# Patient Record
Sex: Male | Born: 1970 | Race: Black or African American | Hispanic: Yes | Marital: Married
Health system: Southern US, Community
[De-identification: ages and names within clinical notes are randomized; demographics above are authoritative.]

## PROBLEM LIST (undated history)

## (undated) DIAGNOSIS — F1029 Alcohol dependence with unspecified alcohol-induced disorder: Secondary | ICD-10-CM

---

## 2005-12-28 ENCOUNTER — Emergency Department: Payer: Self-pay | Admitting: Emergency Medicine

## 2006-11-29 ENCOUNTER — Emergency Department: Payer: Self-pay | Admitting: Emergency Medicine

## 2007-02-24 ENCOUNTER — Encounter: Payer: Self-pay | Admitting: Neurosurgery

## 2007-03-06 ENCOUNTER — Encounter: Payer: Self-pay | Admitting: Neurosurgery

## 2007-03-14 ENCOUNTER — Ambulatory Visit: Payer: Self-pay | Admitting: Neurosurgery

## 2007-04-07 ENCOUNTER — Ambulatory Visit (HOSPITAL_COMMUNITY): Admission: RE | Admit: 2007-04-07 | Discharge: 2007-04-07 | Payer: Self-pay | Admitting: Neurosurgery

## 2007-04-14 ENCOUNTER — Ambulatory Visit: Payer: Self-pay | Admitting: Neurosurgery

## 2008-11-19 IMAGING — CR DG CHEST 2V
2 series · 2 of 2 positions shown · non-contrast
Comparison: None

CLINICAL DATA: Preop HNP

CHEST - 2 VIEW:

[view not recorded (1 of 2)]
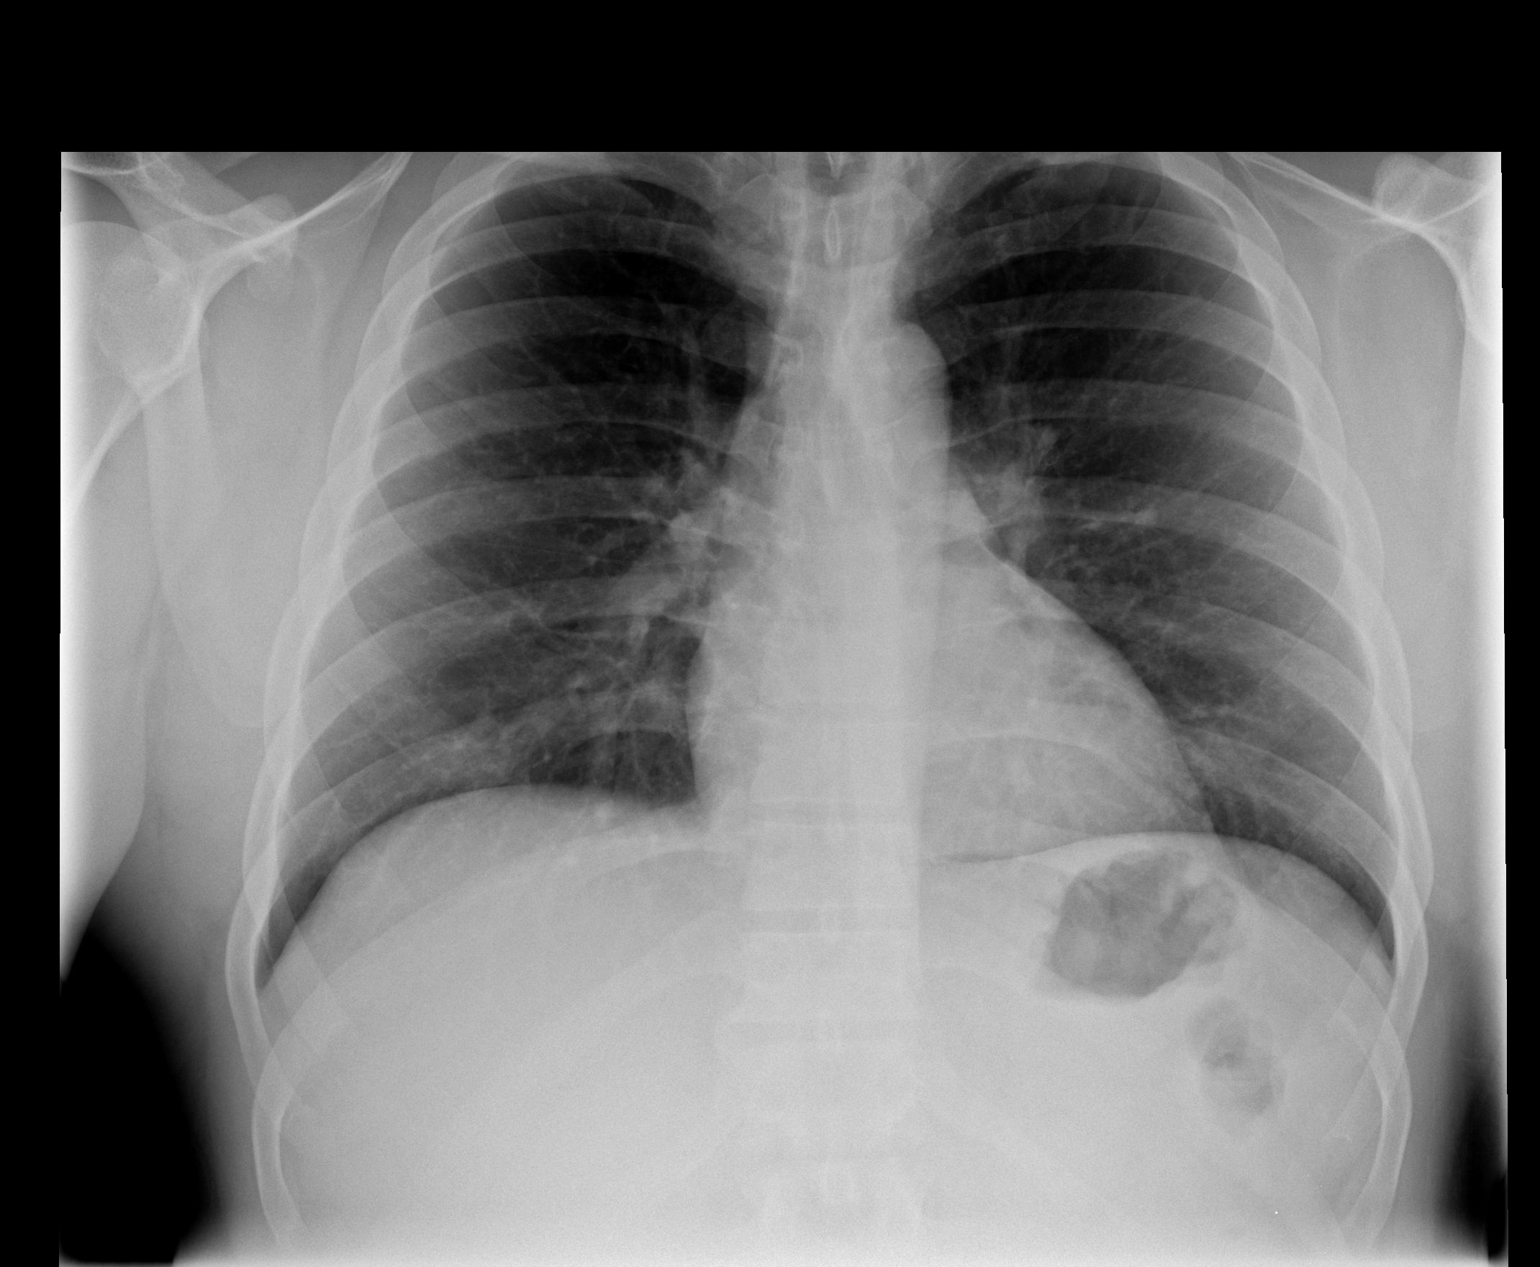

[view not recorded (2 of 2)]
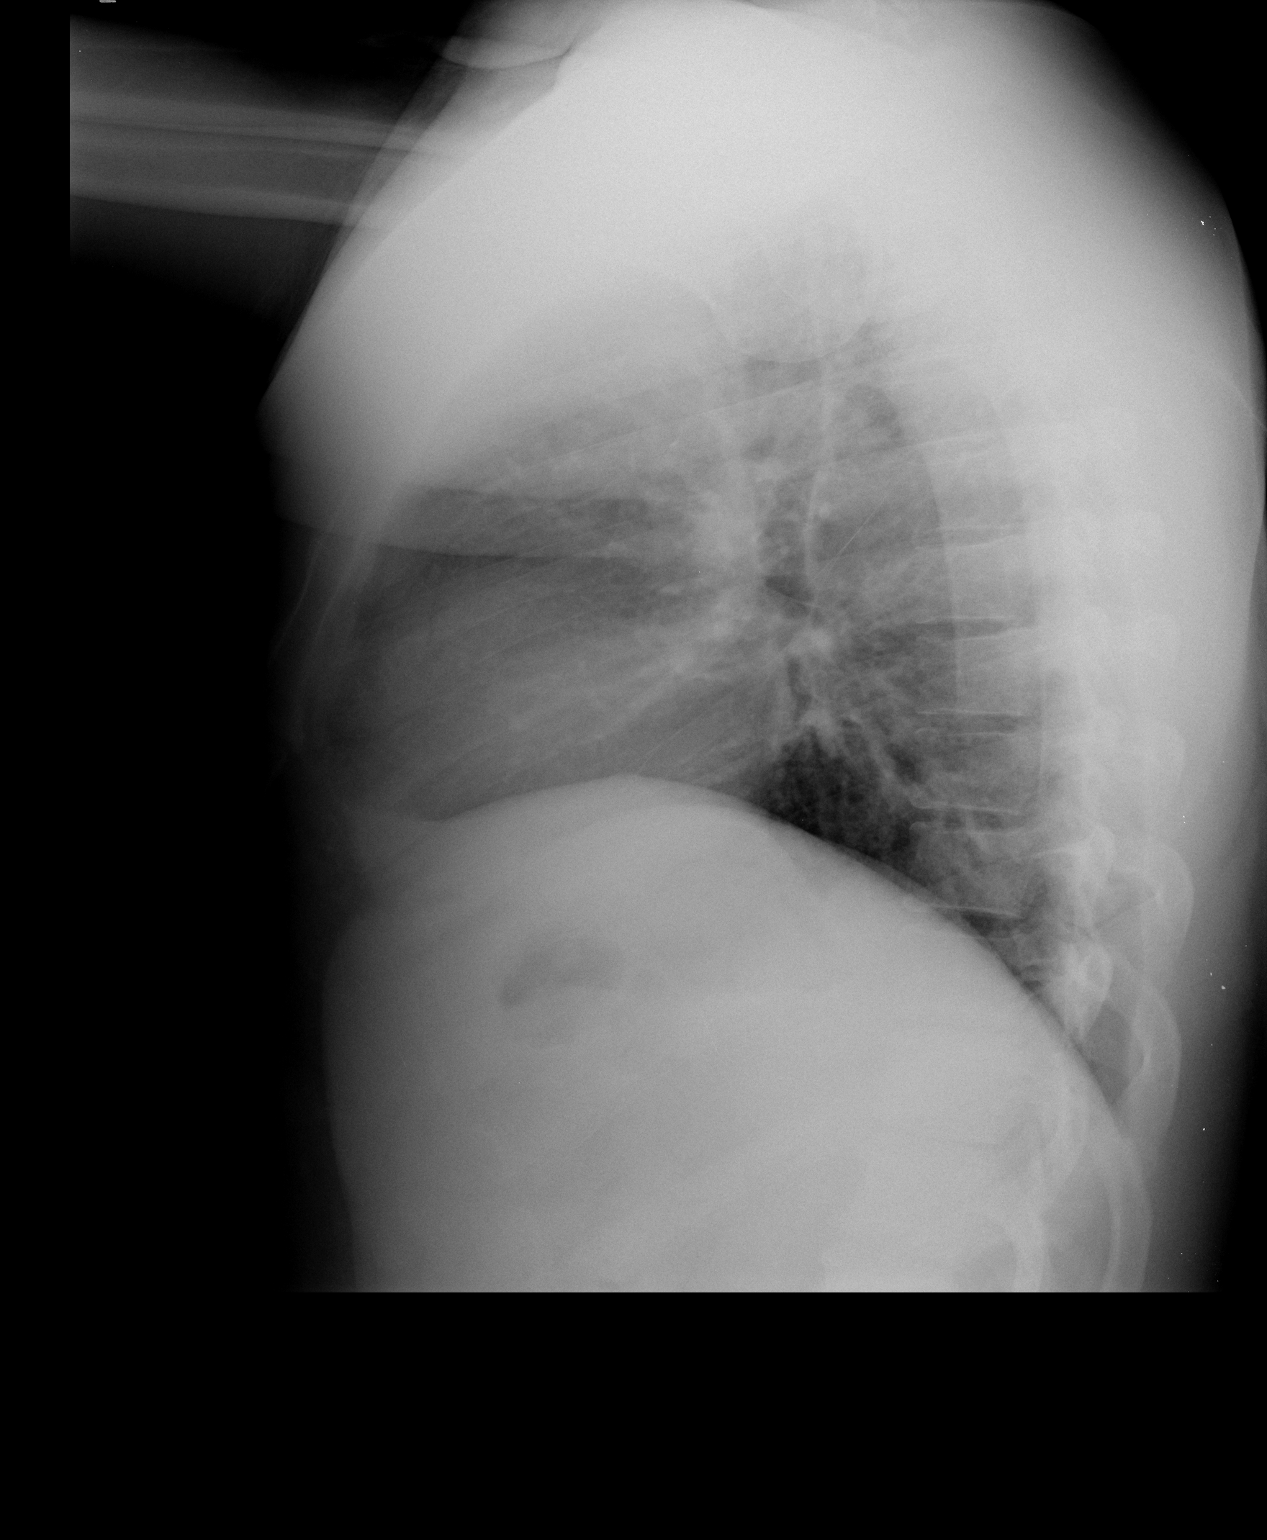

[2 of 2 positions shown; findings below may reference images not displayed]

FINDINGS: The heart size and mediastinal contours are within normal limits. 
Both lungs are clear. No effusions.  The visualized skeletal structures are
unremarkable.
IMPRESSION: No active cardiopulmonary disease

## 2008-11-22 IMAGING — RF DG CERVICAL SPINE 1V
1 series · 2 of 2 positions shown · non-contrast
Comparison: none

CLINICAL DATA: Herniated cervical disc.
 CERVICAL SPINE ? 1 VIEW:

[Series 1: run · 2 of 2 slices shown]
[im 1/2]
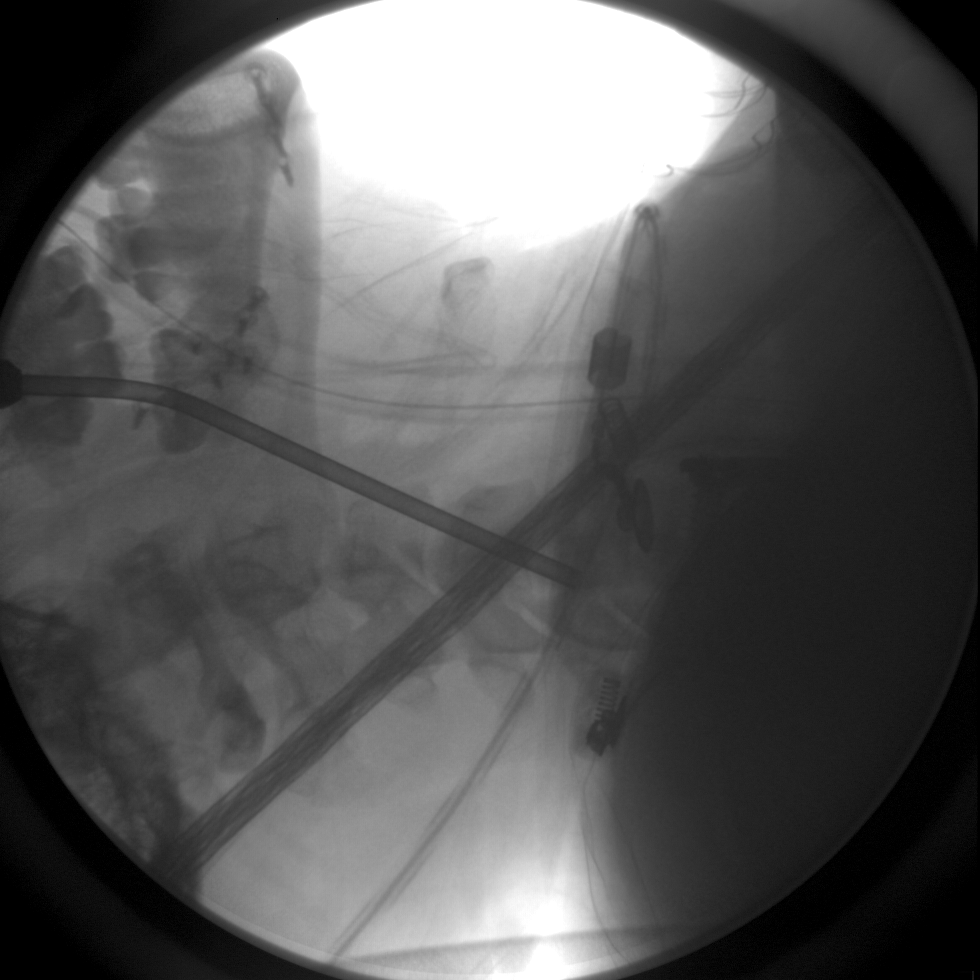
[im 2/2]
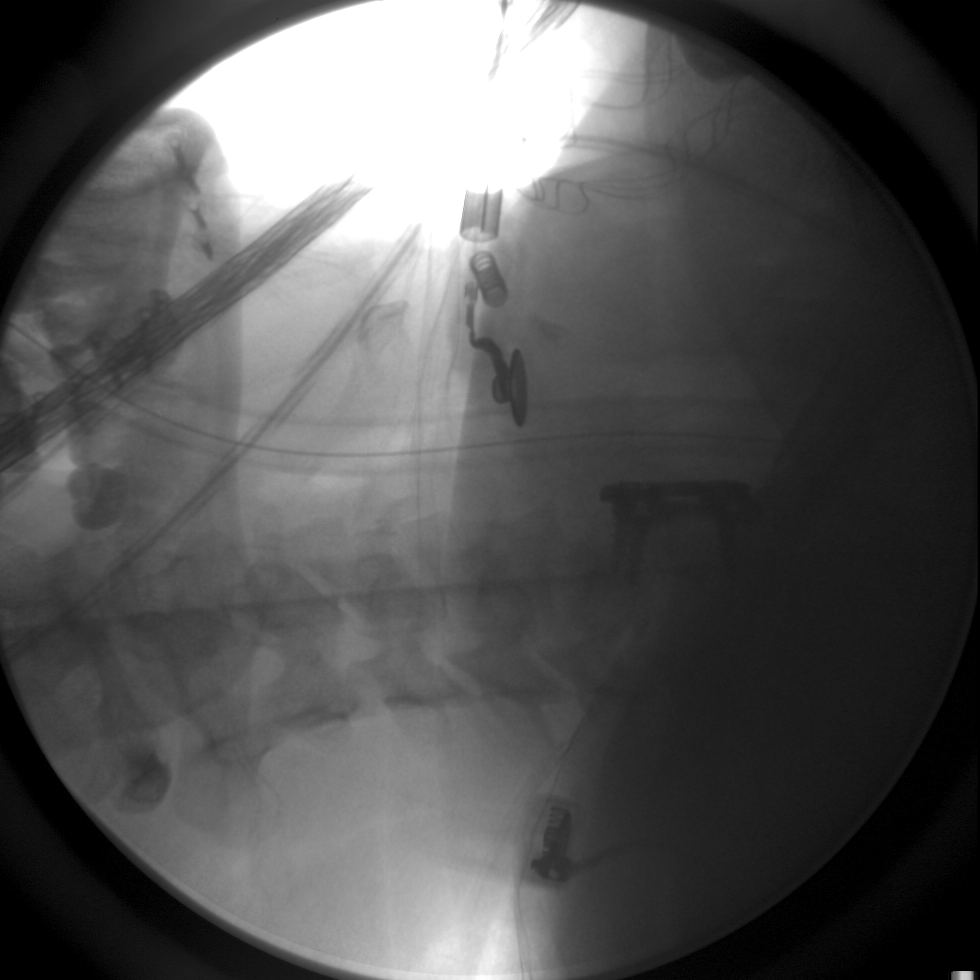

[2 of 2 positions shown; findings below may reference images not displayed]

FINDINGS: Two lateral views demonstrate that the patient has undergone anterior cervical fusion at the C6-7 level.  Anterior plate and screws are visible.  The posterior margins of C7 are not visible because of the patient?s shoulders.
IMPRESSION: Anterior cervical fusion performed at C6-7.

## 2009-04-15 ENCOUNTER — Ambulatory Visit: Payer: Self-pay | Admitting: Adult Health

## 2009-04-29 ENCOUNTER — Ambulatory Visit: Payer: Self-pay | Admitting: Adult Health

## 2009-10-15 ENCOUNTER — Emergency Department: Payer: Self-pay | Admitting: Emergency Medicine

## 2009-10-30 ENCOUNTER — Ambulatory Visit: Payer: Self-pay | Admitting: Family Medicine

## 2009-10-31 ENCOUNTER — Ambulatory Visit: Payer: Self-pay | Admitting: Family Medicine

## 2009-11-02 ENCOUNTER — Emergency Department: Payer: Self-pay | Admitting: Emergency Medicine

## 2010-01-07 ENCOUNTER — Ambulatory Visit: Payer: Self-pay | Admitting: Family Medicine

## 2010-02-13 ENCOUNTER — Emergency Department: Payer: Self-pay | Admitting: Emergency Medicine

## 2010-02-21 ENCOUNTER — Encounter: Admission: RE | Admit: 2010-02-21 | Discharge: 2010-02-21 | Payer: Self-pay | Admitting: Neurosurgery

## 2010-03-23 ENCOUNTER — Ambulatory Visit: Payer: Self-pay | Admitting: Neurosurgery

## 2010-08-18 NOTE — Op Note (Signed)
NAMEJAPHETH, [name]               ACCOUNT NO.:  [id number]   MEDICAL RECORD NO.:  [id number]          PATIENT TYPE:  AMB   LOCATION:  SDS                          FACILITY:  [facility]   PHYSICIAN:  Donalee Citrin, M.D.        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   PROCEDURE:  Anterior cervical diskectomy and fusion at C6-7 using a 7 mm  Cornerstone allograft wedge and a 25 mm venture plate with four 32 mm  Venture plate with __________ screws.   ASSISTANT:  Yetta Barre.   ANESTHESIA:  General endotracheal.   PROCEDURE IN DETAIL:  The patient is a very pleasant 40 year old  gentleman who has had progressive worsening neck and bilateral arm pain  radiating down the first 2 fingers of the left hand predominantly but  also over on the right side.  MRI scan showed severe spondylitic disease  with spondylitic compression of the left C7 nerve root and cervical  stenosis as well as motor changes in the endplates at this level.  The  patient failed all forms of conservative treatment and was recommended  anterior diskectomy and fusion.  The risks and benefits of the operation  were explained to the patient who understood and agreed to proceed  forward.   The patient was brought to the operating room and was induced under  general anesthesia.  The patient was placed supine __________  pounds of  traction on the right side.  The neck was prepped and draped in the  usual fashion.  Preoperative x-ray localized the appropriate level.  A  curvilinear incision was made just off midline to the anterior portion  of the sternocleidomastoid.  The superficial layer of the of the  platysma was dissected out and divided longitudinally.  The avascular  plane between the sternocleidomastoid and the strap  muscles was  developed down to the prevertebral fascia.  Prevertebral fascia was  dissected with Kitners.  Intraoperative x-ray confirmed localization of  the C5-6 level purposefully marking the level above for visualization.  Then the level below that was then incised.  Large anterior osteophyte  was bitten off with a Leksell rongeur and disk space was cleaned out  using __________ Kerrison punch biting off anterior osteophytes off of  C6.  High-speed drill used to drill down the interspace down the  posterior annulus __________ ligament.  At this point, the operating  microscopic was draped and brought onto the field __________ .  The  undersurface of the endplates of both under bitten aggressively.  The  PLL was under bitten decompressing the central canal.  There was a very  large osteophyte coming off the uncinate process compressing the C7  nerve root on the left.  This was all under bitten decompressing and  skeletonizing the C7 nerve root flush with the pedicle out to the left  side.  Then the endplates were under bitten  to decompress the central  canal, marching across to the right side decompressing the right C7  nerve root.  Meticulous hemostasis was maintained.  The endplates were  scraped.  After adequate decompression had been achieved a similar graft  was inserted and a 25 mm Venture plate was placed.  All four 32 mm  screws were drilled in place and all screws had excellent purchase.  Wound was copiously irrigated.  Meticulous hemostasis was maintained.  The wound was closed in layers with interrupted Vicryl in the platysma  and a running 4-0 subcuticular.  Benzoin and Steri-Strips were applied.  The patient went to the recovery room in stable condition.  At the end  of the case needle and sponge counts were correct.           ______________________________  Donalee Citrin, M.D.     GC/MEDQ  D:  04/07/2007  T:  04/07/2007  Job:  [id number]

## 2011-01-08 LAB — BASIC METABOLIC PANEL
BUN: 14
CO2: 29
Calcium: 9.7
Chloride: 105
Creatinine, Ser: 1.08
GFR calc Af Amer: >60
Glucose, Bld: 110 — ABNORMAL HIGH

## 2011-01-08 LAB — HEPATIC FUNCTION PANEL
AST: 38 — ABNORMAL HIGH
Albumin: 3.9
Total Bilirubin: 0.7
Total Protein: 7.4

## 2011-01-08 LAB — CBC
MCHC: 35
MCV: 87.3
Platelets: 411 — ABNORMAL HIGH
RBC: 5.04
RDW: 12.3

## 2011-01-08 LAB — DIFFERENTIAL
Basophils Absolute: 0
Basophils Relative: 0
Eosinophils Absolute: 0.1
Monocytes Relative: 8
Neutro Abs: 5.9
Neutrophils Relative %: 59

## 2011-01-08 LAB — APTT: aPTT: 31

## 2011-01-08 LAB — PROTIME-INR
INR: 1
Prothrombin Time: 13.2

## 2011-08-24 ENCOUNTER — Emergency Department: Payer: Self-pay | Admitting: *Deleted

## 2012-05-02 ENCOUNTER — Emergency Department: Payer: Self-pay | Admitting: Unknown Physician Specialty

## 2012-05-02 LAB — COMPREHENSIVE METABOLIC PANEL
Albumin: 3.8 g/dL (ref 3.4–5.0)
Alkaline Phosphatase: 60 U/L (ref 50–136)
Anion Gap: 5 — ABNORMAL LOW (ref 7–16)
BUN: 13 mg/dL (ref 7–18)
Calcium, Total: 8.6 mg/dL (ref 8.5–10.1)
Chloride: 109 mmol/L — ABNORMAL HIGH (ref 98–107)
Glucose: 88 mg/dL (ref 65–99)
Osmolality: 281 (ref 275–301)
Potassium: 4.1 mmol/L (ref 3.5–5.1)
Sodium: 141 mmol/L (ref 136–145)

## 2012-05-02 LAB — CBC WITH DIFFERENTIAL/PLATELET
Eosinophil #: 0.2 10*3/uL (ref 0.0–0.7)
Lymphocyte %: 33.4 %
MCH: 30.9 pg (ref 26.0–34.0)
Monocyte #: 0.8 x10 3/mm (ref 0.2–1.0)
Platelet: 364 10*3/uL (ref 150–440)
RBC: 4.91 10*6/uL (ref 4.40–5.90)

## 2012-05-02 LAB — SEDIMENTATION RATE: Erythrocyte Sed Rate: 1 mm/hr (ref 0–15)

## 2012-08-04 LAB — COMPREHENSIVE METABOLIC PANEL
Albumin: 4 g/dL (ref 3.4–5.0)
Alkaline Phosphatase: 59 U/L (ref 50–136)
Anion Gap: 5 — ABNORMAL LOW (ref 7–16)
BUN: 11 mg/dL (ref 7–18)
Bilirubin,Total: 0.4 mg/dL (ref 0.2–1.0)
Chloride: 108 mmol/L — ABNORMAL HIGH (ref 98–107)
EGFR (Non-African Amer.): >60
Osmolality: 283 (ref 275–301)
SGOT(AST): 37 U/L (ref 15–37)
Sodium: 143 mmol/L (ref 136–145)
Total Protein: 8.4 g/dL — ABNORMAL HIGH (ref 6.4–8.2)

## 2012-08-04 LAB — TSH: Thyroid Stimulating Horm: 1.28 u[IU]/mL

## 2012-08-04 LAB — CBC
MCH: 31.2 pg (ref 26.0–34.0)
MCHC: 34.7 g/dL (ref 32.0–36.0)

## 2012-08-04 LAB — ETHANOL: Ethanol %: <0.003 % (ref 0.000–0.080)

## 2012-08-05 ENCOUNTER — Inpatient Hospital Stay: Payer: Self-pay | Admitting: Psychiatry

## 2012-08-05 LAB — DRUG SCREEN, URINE: Benzodiazepine, Ur Scrn: NEGATIVE (ref ?–200)

## 2012-10-27 ENCOUNTER — Emergency Department: Payer: Self-pay | Admitting: Emergency Medicine

## 2012-10-27 LAB — BASIC METABOLIC PANEL
BUN: 13 mg/dL (ref 7–18)
Calcium, Total: 9.3 mg/dL (ref 8.5–10.1)
Creatinine: 1.22 mg/dL (ref 0.60–1.30)
Potassium: 3.9 mmol/L (ref 3.5–5.1)
Sodium: 139 mmol/L (ref 136–145)

## 2012-10-27 LAB — URINALYSIS, COMPLETE
Bilirubin,UR: NEGATIVE
Blood: NEGATIVE
Ketone: NEGATIVE
Protein: NEGATIVE
RBC,UR: 2 /HPF (ref 0–5)
Specific Gravity: 1.023 (ref 1.003–1.030)
Squamous Epithelial: <1

## 2012-10-27 LAB — CBC
Platelet: 331 10*3/uL (ref 150–440)
RBC: 4.75 10*6/uL (ref 4.40–5.90)
WBC: 9.1 10*3/uL (ref 3.8–10.6)

## 2013-04-10 IMAGING — CR DG CHEST 2V
1 series · 2 of 2 positions shown · non-contrast
Comparison: none

REASON FOR EXAM: productive cough, fever
COMMENTS:

[Series 1: w chest pa · 0.14mm/px · 2 of 2 slices shown]
[im 1/2]
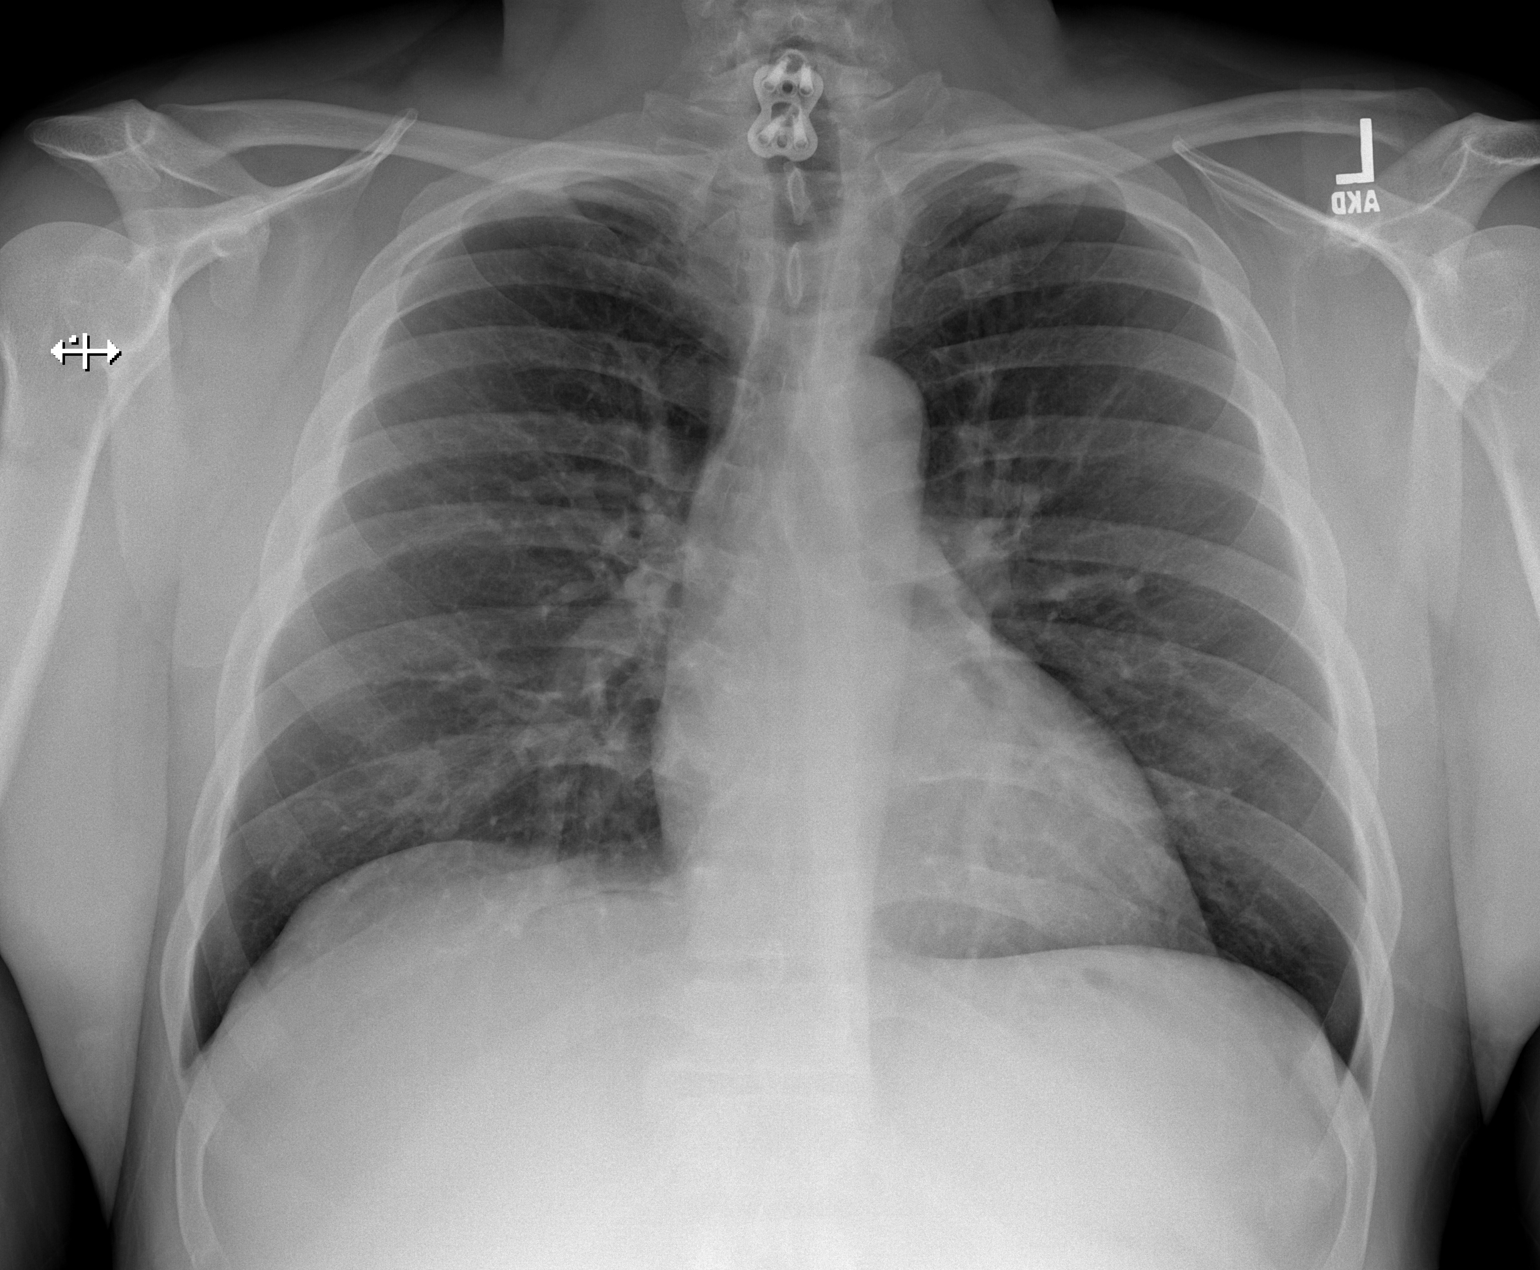
[im 2/2]
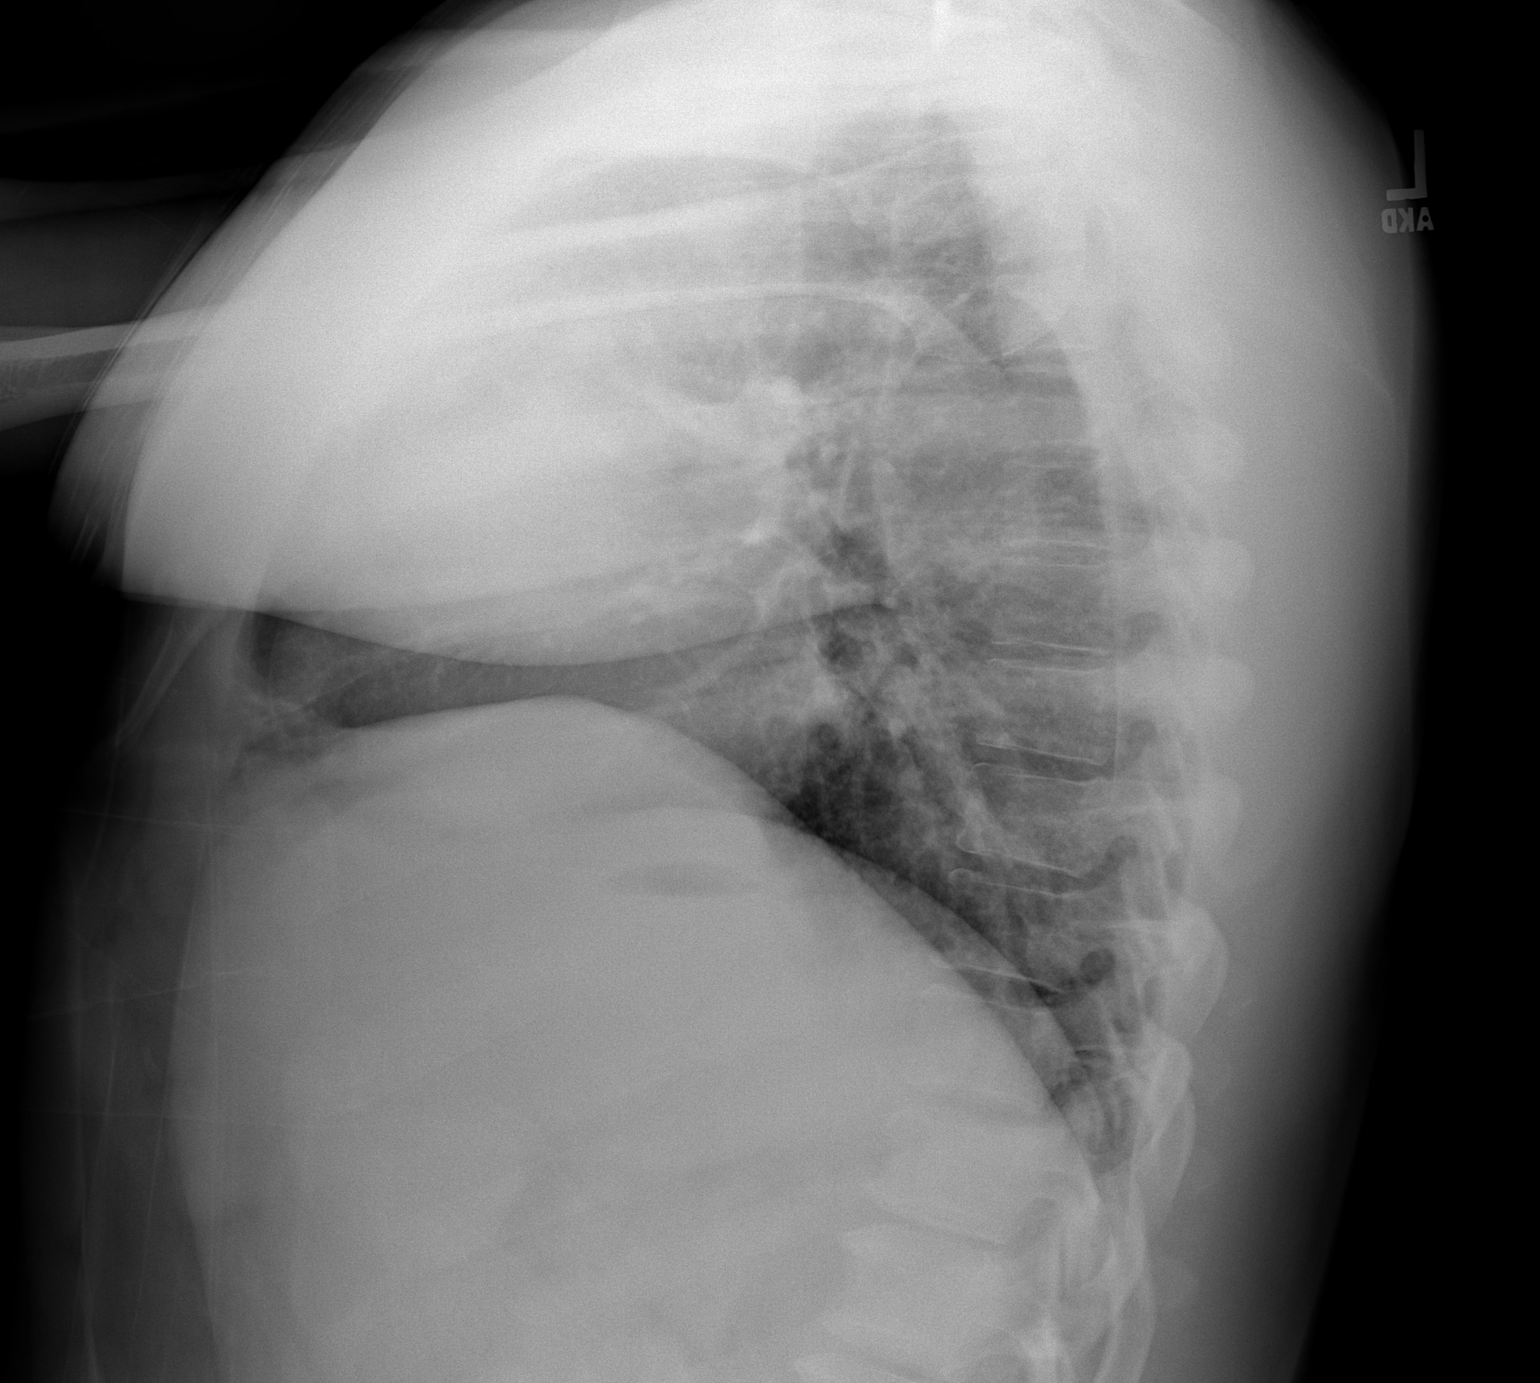

[2 of 2 positions shown; findings below may reference images not displayed]

PROCEDURE:     DXR - DXR CHEST PA (OR AP) AND LATERAL  - August 24, 2011 [DATE]

RESULT:     And lower anterior cervical fusion hardware is present. The
lungs are clear. The heart and pulmonary vessels are normal. The bony and
mediastinal structures are unremarkable. There is no effusion. There is no
pneumothorax or evidence of congestive failure.
IMPRESSION: No acute cardiopulmonary disease.

[REDACTED]

## 2013-12-18 IMAGING — CR DG LUMBAR SPINE 2-3V
1 series · 3 of 3 positions shown · non-contrast
Comparison: none

REASON FOR EXAM: back pain
COMMENTS:

PROCEDURE:     DXR - DXR LUMBAR SPINE AP AND LATERAL  - May 02, 2012  [DATE]
RESULT:     Lumbar spine images demonstrate normal alignment. The disc
spaces and vertebral body heights are maintained. There is no bony
destruction.

[Series 1: ap · 0.17mm/px · 3 of 3 slices shown]
[im 1/3]
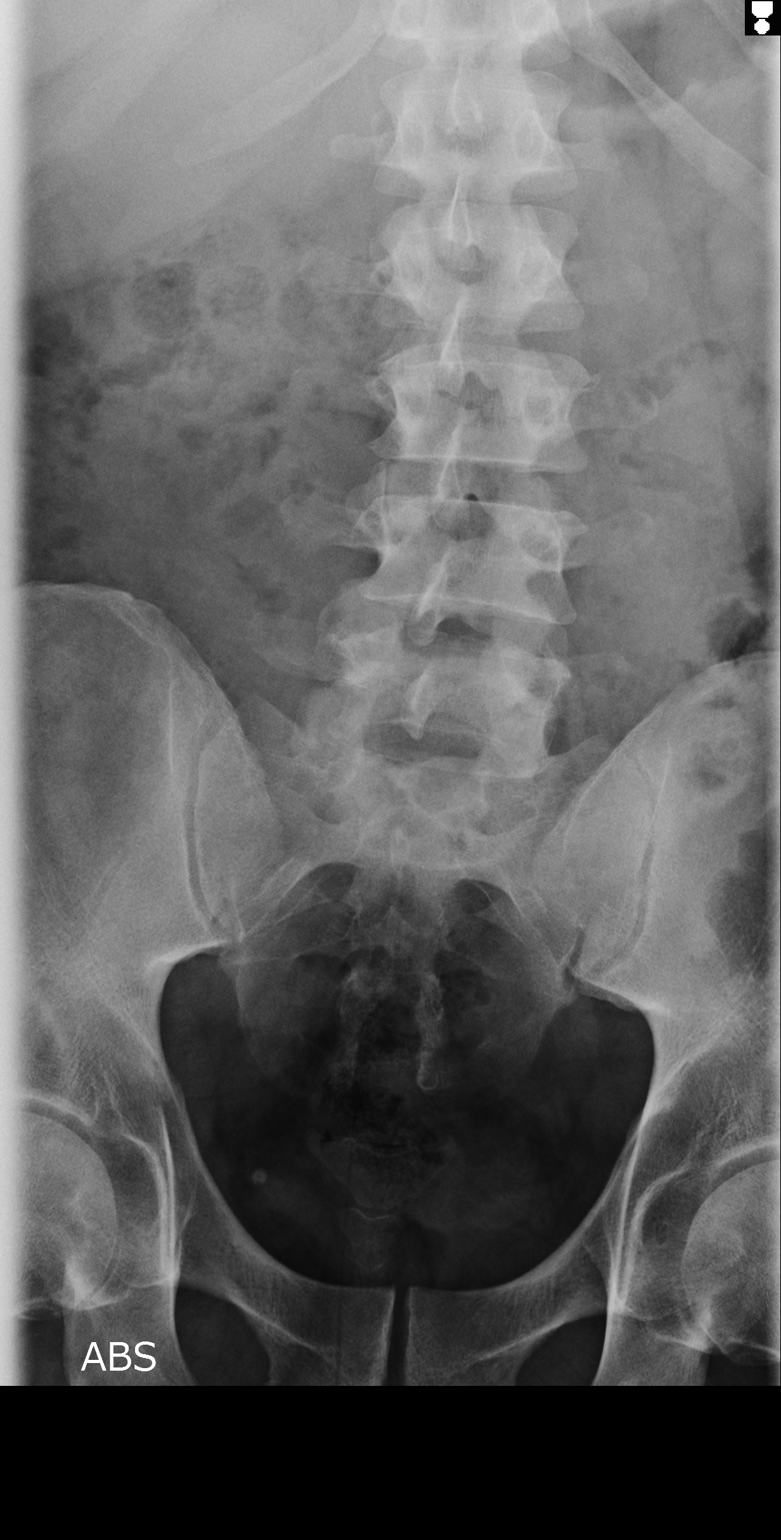
[im 2/3]
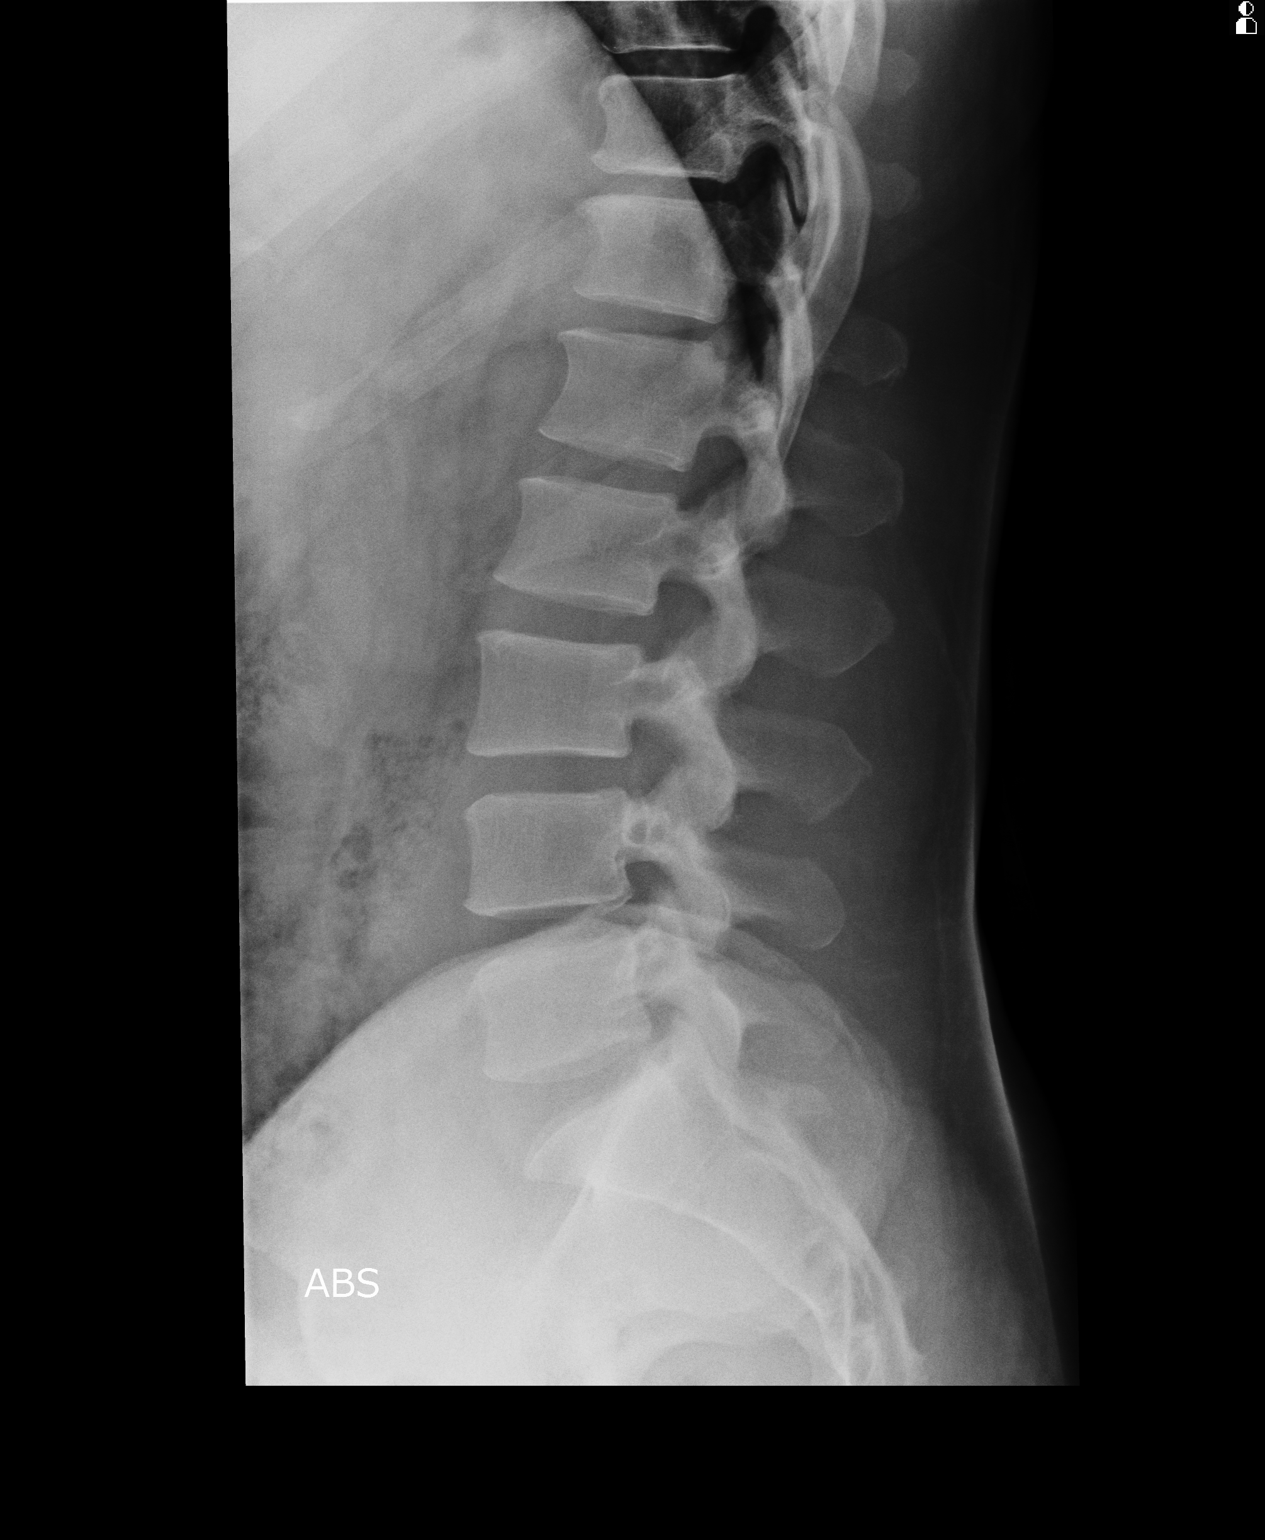
[im 3/3]
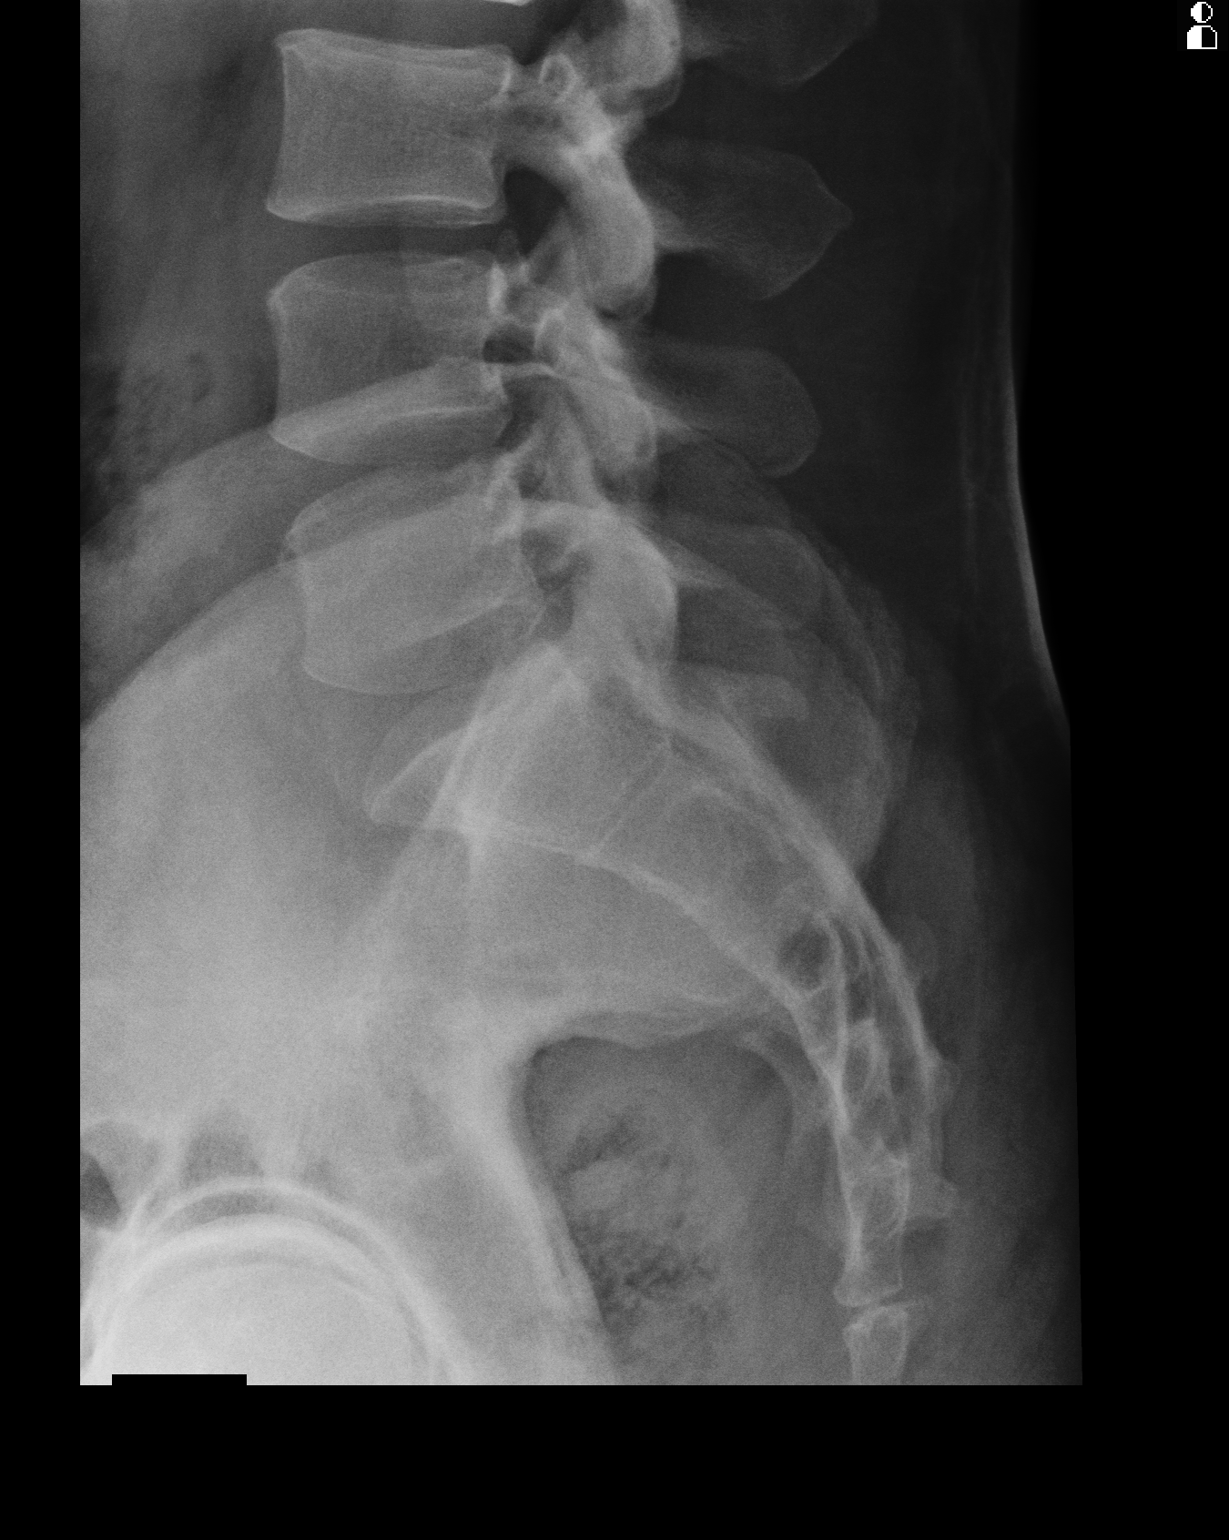

[3 of 3 positions shown; findings below may reference images not displayed]

IMPRESSION: 1. No acute bony abnormality. No significant degenerative disease.

[REDACTED]
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
## 2014-07-26 NOTE — Discharge Summary (Signed)
PATIENT NAME:  [name], Sincere MR#:  [id number] DATE OF BIRTH:  [birth year]  DATE OF ADMISSION:  08/05/2012 DATE OF DISCHARGE:  08/07/2012  HOSPITAL COURSE: See dictated history and physical for details of admission.   This 44 year old man presented to the Emergency Room stating that he was tired of his lifestyle of heavy drinking and cocaine abuse. Also reporting depression and anxiety with suicidal thoughts. The patient was put on the detox protocol and has tolerated that fine. Blood pressure has been running a little high, but there is no sign of seizures or delirium. Mood continues to be somewhat dysphoric, without active suicidal intent.   He was started on citalopram 40 mg a day at the time of admission and so far has tolerated that fine. A [facility] [place]. The patient would be best-served by a specialized substance abuse treatment program for both detox and longer-term treatment. He is under an involuntary commitment petition. Will be transferred there today. The patient has been educated about this and informed of the rationale and plan for treatment and transfer.   DISCHARGE MEDICATIONS: Citalopram 40 mg per day, magnesium 400 mg per day, nicotine patch 21 mg per day, thiamine 100 mg per day; detox orders otherwise as done at the receiving facility.   LABORATORY RESULTS: Admission labs showed chemistry with a creatinine elevated at 1.4, chloride elevated at 108, total protein elevated 8.4. Alcohol undetected.   CBC elevated 13.4. TSH normal at 1.28, drug screen positive for cocaine.   MENTAL STATUS EXAM AT DISCHARGE: Reasonably well-groomed man, cooperative with the interview. Intermittent eye contact. Psychomotor activity normal. Speech normal in rate, tone and volume. Affect a little bit blunted and constricted, but not severely so. Affect not sad or tearful or hostile. Mood stated as still being nervous. Thoughts are lucid,  with no evidence of loosening of associations or delusions. Denies auditory or visual hallucinations. Denies suicidal or homicidal ideation. Shows adequate judgment and insight. Normal intelligence.   DISCHARGE PLAN: Transfer under involuntary commitment to [facility] [place].   DIAGNOSIS, PRINCIPAL AND PRIMARY:   AXIS I: Alcohol dependence.   SECONDARY DIAGNOSES: AXIS I:  1.  Cocaine dependence.  2.  Substance-induced mood disorder, depressed.   AXIS II: Deferred.  AXIS III: No diagnosis.  AXIS IV: Moderate-to-severe from multiple losses from substance abuse.  AXIS V: Functioning at time of transfer: 50.    ____________________________ Audery AmelJohn T. Jhett Fretwell, MD jtc:dm D: 08/07/2012 12:57:30 ET T: 08/07/2012 13:30:26 ET JOB#: [id number]  cc: Audery AmelJohn T. Jurrell Royster, MD, <Dictator> Audery AmelJOHN T Almeter Westhoff MD ELECTRONICALLY SIGNED 08/08/2012 13:54

## 2019-04-03 ENCOUNTER — Inpatient Hospital Stay
Admit: 2019-04-03 | Discharge: 2019-04-08 | Disposition: A | Discharge status: Home / Self Care | Payer: PRIVATE HEALTH INSURANCE | Source: Ambulatory Visit | Admitting: Student in an Organized Health Care Education/Training Program

## 2019-04-03 DIAGNOSIS — F1029 Alcohol dependence with unspecified alcohol-induced disorder: Secondary | ICD-10-CM

## 2019-04-03 LAB — BASIC METABOLIC PANEL
Anion Gap: 8 NA
BUN: 9 mg/dL (ref 7–20)
CO2: 28 mmol/L (ref 22–30)
Calcium: 9.3 mg/dL (ref 8.4–10.4)
Chloride: 107 mmol/L (ref 98–107)
Creatinine: 0.97 mg/dL (ref 0.52–1.25)
EGFR IF NonAfrican American: >90 mL/min (ref >60)
Glucose: 88 mg/dL (ref 70–100)
Potassium: 4 mmol/L (ref 3.5–5.1)
Sodium: 143 mmol/L (ref 135–145)
eGFR African American: >90 mL/min (ref >60)

## 2019-04-03 LAB — CBC WITH AUTO DIFFERENTIAL
Absolute Baso #: 0 10*3/uL (ref 0.0–0.2)
Absolute Eos #: 0.1 10*3/uL (ref 0.0–0.5)
Absolute Lymph #: 2.3 10*3/uL (ref 1.0–4.3)
Absolute Mono #: 0.9 10*3/uL — ABNORMAL HIGH (ref 0.0–0.8)
Absolute Neut #: 5 10*3/uL (ref 1.8–7.0)
Basophils: 0.6 % (ref 0.0–2.0)
Eosinophils: 1.7 % (ref 1.0–6.0)
Granulocytes %: 58.8 % (ref 40.0–80.0)
Hematocrit: 43.4 % (ref 40.0–52.0)
Hemoglobin: 14.8 g/dL (ref 13.0–18.0)
Lymphocyte %: 27.8 % (ref 20.0–40.0)
MCH: 31.9 pg (ref 26.0–34.0)
MCHC: 34.1 % (ref 32.0–36.0)
MCV: 93.4 fL (ref 80.0–98.0)
MPV: 6.5 fL — ABNORMAL LOW (ref 7.4–10.4)
Monocytes: 11.1 % — ABNORMAL HIGH (ref 2.0–10.0)
Platelets: 377 10*3/uL (ref 140–440)
RBC: 4.65 10*6/uL (ref 4.40–5.90)
RDW: 13.7 % (ref 11.5–14.5)
WBC: 8.4 10*3/uL (ref 3.6–10.7)

## 2019-04-03 LAB — URINE DRUG SCREEN
Amphetamines, urine: NEGATIVE NA
Barbiturates, Urine: NEGATIVE NA
Benzodiazepine Ur Qual: NEGATIVE NA
Cocaine Metabolites, Ur: POSITIVE NA
Methadone, Urine: NEGATIVE NA
Opiates, Urine: NEGATIVE NA
Oxycodone Screen, Ur: NEGATIVE NA
PCP, Urine: NEGATIVE NA

## 2019-04-03 LAB — HEPATIC FUNCTION PANEL
ALT: 36 U/L (ref 0–49)
AST: 95 U/L — ABNORMAL HIGH (ref 15–46)
Albumin,Serum: 4.3 g/dL (ref 3.5–5.0)
Alkaline Phosphatase: 68 U/L (ref 38–126)
Bilirubin, Direct: 0 mg/dL (ref 0.0–0.3)
Total Bilirubin: 0.6 mg/dL (ref 0.2–1.3)
Total Protein: 7.8 g/dL (ref 6.3–8.2)

## 2019-04-03 LAB — D-DIMER, QUANTITATIVE: D-Dimer, Quant: <0.19 mg/L (ref 0.00–0.50)

## 2019-04-03 LAB — LACTATE DEHYDROGENASE: LD: 286 U/L — ABNORMAL HIGH (ref 120–246)

## 2019-04-03 LAB — PROCALCITONIN: Procalcitonin: <0.1 ng/mL (ref <0.10)

## 2019-04-03 LAB — C-REACTIVE PROTEIN: CRP: <5 mg/L (ref 0.0–6.0)

## 2019-04-03 LAB — ETHANOL: Ethanol Lvl: <0.01 g/dL (ref 0.000–0.010)

## 2019-04-03 LAB — FERRITIN: Ferritin: 90 ng/mL (ref 18–464)

## 2019-04-03 LAB — COVID-19: SARS-CoV-2: DETECTED — AB

## 2019-04-03 MED ORDER — POLYETHYLENE GLYCOL 3350 17 G PO PACK
17 g | Freq: Every day | ORAL | Status: DC | PRN
Start: 2019-04-03 — End: 2019-04-08

## 2019-04-03 MED ORDER — MELATONIN 5 MG PO TABS
5 MG | Freq: Every evening | ORAL | Status: DC | PRN
Start: 2019-04-03 — End: 2019-04-08
  Administered 2019-04-04 – 2019-04-08 (×2): 5 mg via ORAL

## 2019-04-03 MED ORDER — AMLODIPINE BESYLATE 5 MG PO TABS
5 MG | Freq: Once | ORAL | Status: AC
Start: 2019-04-03 — End: 2019-04-03
  Administered 2019-04-03: 18:00:00 5 mg via ORAL

## 2019-04-03 MED ORDER — NORMAL SALINE FLUSH 0.9 % IV SOLN
0.9 % | INTRAVENOUS | Status: DC | PRN
Start: 2019-04-03 — End: 2019-04-08

## 2019-04-03 MED ORDER — DIPHENHYDRAMINE HCL 50 MG/ML IJ SOLN
50 MG/ML | Freq: Once | INTRAMUSCULAR | Status: DC
Start: 2019-04-03 — End: 2019-04-03

## 2019-04-03 MED ORDER — PHENOBARBITAL 32.4 MG PO TABS
32.4 MG | Freq: Once | ORAL | Status: AC
Start: 2019-04-03 — End: 2019-04-03
  Administered 2019-04-03: 17:00:00 97.2 mg via ORAL

## 2019-04-03 MED ORDER — THIAMINE MONONITRATE 100 MG PO TABS
100 MG | Freq: Every day | ORAL | Status: DC
Start: 2019-04-03 — End: 2019-04-04
  Administered 2019-04-03: 23:00:00 100 mg via ORAL

## 2019-04-03 MED ORDER — ACETAMINOPHEN 650 MG RE SUPP
650 MG | Freq: Four times a day (QID) | RECTAL | Status: DC | PRN
Start: 2019-04-03 — End: 2019-04-08

## 2019-04-03 MED ORDER — CLONIDINE HCL 0.1 MG PO TABS
0.1 MG | Freq: Once | ORAL | Status: AC
Start: 2019-04-03 — End: 2019-04-03
  Administered 2019-04-03: 18:00:00 0.1 mg via ORAL

## 2019-04-03 MED ORDER — ENOXAPARIN SODIUM 40 MG/0.4ML SC SOLN
40 MG/0.4ML | Freq: Every day | SUBCUTANEOUS | Status: DC
Start: 2019-04-03 — End: 2019-04-08
  Administered 2019-04-03 – 2019-04-07 (×4): 40 mg via SUBCUTANEOUS

## 2019-04-03 MED ORDER — ACETAMINOPHEN 325 MG PO TABS
325 MG | Freq: Four times a day (QID) | ORAL | Status: DC | PRN
Start: 2019-04-03 — End: 2019-04-08
  Administered 2019-04-04 – 2019-04-05 (×3): 650 mg via ORAL

## 2019-04-03 MED ORDER — FOLIC ACID 1 MG PO TABS
1 MG | Freq: Every day | ORAL | Status: DC
Start: 2019-04-03 — End: 2019-04-08
  Administered 2019-04-03 – 2019-04-08 (×6): 1 mg via ORAL

## 2019-04-03 MED ORDER — METOCLOPRAMIDE HCL 5 MG/ML IJ SOLN
5 MG/ML | Freq: Once | INTRAMUSCULAR | Status: DC
Start: 2019-04-03 — End: 2019-04-03

## 2019-04-03 MED ORDER — ONDANSETRON HCL 4 MG/2ML IJ SOLN
4 MG/2ML | Freq: Four times a day (QID) | INTRAMUSCULAR | Status: DC | PRN
Start: 2019-04-03 — End: 2019-04-08

## 2019-04-03 MED ORDER — PROMETHAZINE HCL 25 MG PO TABS
25 MG | Freq: Four times a day (QID) | ORAL | Status: DC | PRN
Start: 2019-04-03 — End: 2019-04-08

## 2019-04-03 MED ORDER — NORMAL SALINE FLUSH 0.9 % IV SOLN
0.9 | Freq: Two times a day (BID) | INTRAVENOUS | Status: DC
Start: 2019-04-03 — End: 2019-04-08
  Administered 2019-04-04: 04:00:00 10 mL via INTRAVENOUS
  Administered 2019-04-04: 14:00:00 3 mL via INTRAVENOUS
  Administered 2019-04-05 – 2019-04-08 (×7): 10 mL via INTRAVENOUS

## 2019-04-03 MED ORDER — PHENOBARBITAL SODIUM 65 MG/ML IJ SOLN
65 MG/ML | Freq: Three times a day (TID) | INTRAMUSCULAR | Status: DC
Start: 2019-04-03 — End: 2019-04-04
  Administered 2019-04-03 – 2019-04-04 (×3): 65 mg via INTRAVENOUS

## 2019-04-03 MED FILL — THIAMINE MONONITRATE 100 MG PO TABS: 100 mg | ORAL | Qty: 1

## 2019-04-03 MED FILL — ENOXAPARIN SODIUM 40 MG/0.4ML SC SOLN: 40 MG/0.4ML | SUBCUTANEOUS | Qty: 0.4

## 2019-04-03 MED FILL — PHENOBARBITAL SODIUM 65 MG/ML IJ SOLN: 65 mg/mL | INTRAMUSCULAR | Qty: 1

## 2019-04-03 MED FILL — CLONIDINE HCL 0.1 MG PO TABS: 0.1 mg | ORAL | Qty: 1

## 2019-04-03 MED FILL — FOLIC ACID 1 MG PO TABS: 1 mg | ORAL | Qty: 1

## 2019-04-03 MED FILL — AMLODIPINE BESYLATE 5 MG PO TABS: 5 mg | ORAL | Qty: 1

## 2019-04-03 MED FILL — PHENOBARBITAL 32.4 MG PO TABS: 32.4 mg | ORAL | Qty: 3

## 2019-04-03 NOTE — H&P (Signed)
[facility] History and Physical        Charles Fuller  DOB:  15-Sep-1970(48 y.o.)  MRN:  [id number]    Date: 04/03/2019 3:15 PM    Subjective:      ChiefComplaint: detox    HPI  Patient is a 48 y/o M with PMHx of anxiety, depression, bipolar disorder who presents to ED requesting detox from alcohol and cocaine detox. Patient was just at [facility] residential rehab 1 week ago and left to continue using cocaine and drinking alcohol. Patient reports he regrets leaving and feels he needs to get back on his medication for anxiety and depression. He was on buspar but hasn't been on this for the past 48m. He recently moved back to [place] area from [place]. He and his wife are currently going through a separation/divorce. He reports he drinks fifteen 25oz beers, last drink was 5 am this morning. Also using cocaine daily x 25m and last smoked/snorted last night.   He reports taking Percoset for back pain but does not have PCP in the area. He is agreeable to inpatient treatment at this time and ongoing rehab and psych evaluation. Reports tremors, sweats, anxiety, mild HA. No SI/HI. Denies any specific COVID exposures or SOB, CP, cough, f/c, n/v, fatigue, myalgias.     In the ED VS initially notable for BP 160/110, 97% on RA, labs largely unremarkable except for Utox positive for cocaine. COVID positive. Patient was given clonidine, norvasc, and phenobarbital and admitted for further management.     Past Medical History:   Diagnosis Date   ??? Anxiety    ??? Bipolar 1 disorder (HCC)    ??? Depression      History reviewed. No pertinent surgical history.  History reviewed. No pertinent family history.  Social History     Socioeconomic History   ??? Marital status: Married     Spouse name: Not on file   ??? Number of children: Not on file   ??? Years of education: Not on file   ??? Highest education level: Not on file   Occupational History   ??? Not on file   Social Needs   ??? Financial resource strain: Not on file   ??? Food insecurity      Worry: Not on file     Inability: Not on file   ??? Transportation needs     Medical: Not on file     Non-medical: Not on file   Tobacco Use   ??? Smoking status: Light Tobacco Smoker   ??? Smokeless tobacco: Never Used   Substance and Sexual Activity   ??? Alcohol use: Yes     Comment: 15-25oz cans daily   ??? Drug use: Yes     Types: Cocaine, Marijuana     Comment: cocaine (1gm daily)   ??? Sexual activity: Not on file   Lifestyle   ??? Physical activity     Days per week: Not on file     Minutes per session: Not on file   ??? Stress: Not on file   Relationships   ??? Social Wellsite geologist on phone: Not on file     Gets together: Not on file     Attends religious service: Not on file     Active member of club or organization: Not on file     Attends meetings of clubs or organizations: Not on file     Relationship status: Not on file   ???  Intimate partner violence     Fear of current or ex partner: Not on file     Emotionally abused: Not on file     Physically abused: Not on file     Forced sexual activity: Not on file   Other Topics Concern   ??? Not on file   Social History Narrative   ??? Not on file     No Known Allergies  Prior to Admission medications    Medication Sig Start Date End Date Taking? Authorizing Provider   busPIRone HCl (BUSPAR PO) Take by mouth   Yes Historical Provider, MD     Interval Pertinent History:  Social History     Tobacco Use   ??? Smoking status: Light Tobacco Smoker   ??? Smokeless tobacco: Never Used   Substance Use Topics   ??? Alcohol use: Yes     Comment: 15-25oz cans daily     Review of Systems   Constitutional: Positive for diaphoresis. Negative for activity change, chills and fever.   HENT: Negative for congestion and sore throat.    Eyes: Negative for pain and visual disturbance.   Respiratory: Negative for cough and shortness of breath.    Cardiovascular: Negative for chest pain, palpitations and leg swelling.   Gastrointestinal: Negative for abdominal pain, constipation, diarrhea, nausea  and vomiting.   Genitourinary: Negative for dysuria, frequency and hematuria.   Musculoskeletal: Positive for back pain. Negative for myalgias.   Skin: Negative for color change and rash.   Neurological: Positive for tremors and headaches. Negative for dizziness.   Hematological: Does not bruise/bleed easily.   Psychiatric/Behavioral: Positive for dysphoric mood. Negative for confusion, hallucinations and suicidal ideas. The patient is nervous/anxious.        Objective:        Vitals:    04/03/19 1217 04/03/19 1306 04/03/19 1410 04/03/19 1514   BP: (!) 176/100 (!) 176/100 (!) 132/93 125/80   Pulse: 72  87 75   Resp: 18  18 18    Temp:   98.3 ??F (36.8 ??C) 98 ??F (36.7 ??C)   TempSrc:    Oral   SpO2: 99%  99% 94%   Weight:       Height:         Physical Exam  Due to the current efforts to prevent transmission of COVID-19 and also the need to preserve PPE for other caregivers, a face-to-face encounter with the patient was not performed. That being said, all relevant records and diagnostic tests were reviewed, including laboratory results and imaging. Please refer to physician attestation for full physical exam findings.     Lab Results   Component Value Date    NA 143 04/03/2019    K 4.0 04/03/2019    CL 107 04/03/2019    CO2 28 04/03/2019    BUN 9 04/03/2019    CREATININE 0.97 04/03/2019    GLUCOSE 88 04/03/2019    CALCIUM 9.3 04/03/2019    PROT 7.8 04/03/2019    LABALBU 4.3 04/03/2019    BILITOT 0.6 04/03/2019    ALKPHOS 68 04/03/2019    AST 95 (H) 04/03/2019    ALT 36 04/03/2019     Lab Results   Component Value Date    WBC 8.4 04/03/2019    HGB 14.8 04/03/2019    HCT 43.4 04/03/2019    MCV 93.4 04/03/2019    PLT 377 04/03/2019       Chest X-Ray: Pending  Procalcitonin: pending  Additional results since  admission have been reviewed.    Assessment and Plan:      Active Problems:    Alcohol dependence, continuous (HCC)    COVID-19    Anxiety  Resolved Problems:    * No resolved hospital problems. *      Full  code    #Alcohol and cocaine abuse/detox   -Utox positive for cocaine, EtOH level negative  -ADM consult, phenobarb   -thiamine, folic acid supplements, CIWA, seizure precautions    #Novel CoronaVirus Infection (COVID-19) [U07.1]  -COVID positive 12/29, asymptomatic-low inflammatory markers, suspect may be viral shedding and not active infection  -Respiratory status on RA--check CXR  -Remdesivir/steroids not indicated due to asymptomatic     #Anxiety/depression/Bipolar Disorder- will consult psych for medication management and hold off on restarting Buspar at this time    BMI Classification: Body mass index is 30.41 kg/m??.  DVTProphylaxis: lovenox 40 q 24hr - creatinine clearance >30  Disposition:await test results, await clinical improvement and anticipate discharge 1-2d  I spent over 51% of total time providing counseling or in coordination of care: > 50 minutes   patient and family updated and I personally reviewed chart, data, labs radiology reports    7AM-4PM Please Perfect serve Durel SaltsAndrea Hobson, PA-C or page: [phone]  Electronically signed by Hulen LusterAndie Hobson, PA-C on 04/03/2019 at 3:15 PM    6PM-6AM please page:  [facility]

## 2019-04-03 NOTE — ED Provider Notes (Signed)
Emergency Department Encounter  [facility] 5E MED SURG    Patient: Charles Fuller  MRN: [id number]  DOB: Jun 09, 1970  Date of Evaluation: 04/03/2019  ED Supervising Physician: Dolly Rias, MD    I independently examined and evaluated Charles Fuller.    In brief HPI , Focused exam: Charles Fuller is a 48 y.o. male that presents to the emergency department frequent alcoholic cocaine use.  Here requesting inpatient detox.  My evaluation no acute distress.  Heart rates normal.  Respirations even and unlabored    Brief ED course/MDM: We will refer for inpatient detox.    Re-Assessments:         CRITICAL CARE TIME   Total Critical Care time was 0 minutes, excluding separately reportable procedures.  There was a high probability of clinically significant/life threatening deterioration in the patient's condition which required my urgent intervention.       All diagnostic, treatment, and disposition decisions were made by myself in conjunction with the APP/Resident. For all further details of the patient's emergency department visit, please see their documentation.    (Please note that portions of this note may have been completed with a voice recognition program. Efforts were made to edit the dictations but occasionally words are mis-transcribed.)    Dolly Rias, MD  [employer]       Dolly Rias, MD  04/06/19 [phone]

## 2019-04-03 NOTE — ED Notes (Signed)
ACC RN to bedside to introduce self and explain available services and resources. PT resting in bed, and is alert and oriented. PT reports that they would like inpatient detox at this time. PT would not prefer a Peer Recovery Coach to be contacted at this time, but was acceptant to a referral card. RN educated PT to ask for MAT Nurse, if they have any further questions. PT in NAD, and call light in reach.        Doy Mince [place], RN  04/03/19 1223

## 2019-04-03 NOTE — ED Notes (Signed)
Pt resting in bed denies needs at this time.Warm blanket provided     Adam Phenix, RN  04/03/19 1533

## 2019-04-03 NOTE — ED Notes (Signed)
Menu provided, pt will order meal.     Charles Dike L. Alycia Rossetti, RN  04/03/19 1116

## 2019-04-03 NOTE — ED Notes (Signed)
Provider notified covid swab is positive     Dadrian Ballantine L. Alycia Rossetti, RN  04/03/19 1310

## 2019-04-03 NOTE — ED Notes (Signed)
Med eval in progressl     Charles Fuller. Charles Celeste, RN  04/03/19 1450

## 2019-04-03 NOTE — ED Notes (Signed)
Meal delivered     [place] L. Alycia Rossetti, RN  04/03/19 1205

## 2019-04-03 NOTE — ED Notes (Signed)
Pt admits that he used to take anti hypertensive meds but quit taking them a while ago. Pt does not know the name of the medication. Pt then educated on HTN and importance of controlling it. Pt agrees to follow up with PCP.     Brace Welte L. Alycia Rossetti, RN  04/03/19 1221

## 2019-04-03 NOTE — ED Provider Notes (Signed)
Emergency DepartmentEncounter  [facility] EMERGENCY DEPT    Patient: Charles Fuller  ZOX:[id number]  DOB: 1970-09-10  Date of Evaluation: 04/03/2019  ED APP Provider: Sherwood Gambler, PA-C    EDcare was supervised by Dr. Mikey Bussing who independently examined and evaluated the patient. Please see their attestation note for further details.    Chief Complaint       Chief Complaint   Patient presents with   . Addiction Problem     Pt presents to ED requesting rehad/detox from ETOH & cocaine. Pt left [facility] about a week ago and continued to drink and do cocaine. Pt drinks approx 15-25oz cans beer daily with cocaine use daily.      HOPI     I was wearing an n95  mask for the entirety of this encounter.      Charles Fuller is a 48 y.o. male whopresents to the emergency department for evaluation, requesting alcohol detox. Report that he was recently discharged from a rehabilitation facility about one week ago and states that he began drinking shortly after. He reports that he has been drinking about 15, 25 ounce beers per day, last drink was at 5 AM this morning. He denies any history of withdrawal seizures in the past. Patient reports that the rehabilitation that he was in what [facility]. Patient does also admit additional cocaine use, no additional illicit substance use. Patient has no SI, HI denies any chest pain, shortness without fevers, cough or congestion, exposure to carbon 19th abdominal pain, nausea, vomiting.    ROS:     Review of Systems  At least 10 systems reviewed and otherwise acutely negative except as in the HOPI.    Past History     Past Medical History:   Diagnosis Date   . Anxiety    . Bipolar 1 disorder (HCC)    . Depression      History reviewed. No pertinent surgical history.  Social History     Socioeconomic History   . Marital status: Married     Spouse name: None   . Number of children: None   . Years of education: None   . Highest education level: None   Occupational History   . None    Social Needs   . Financial resource strain: None   . Food insecurity     Worry: None     Inability: None   . Transportation needs     Medical: None     Non-medical: None   Tobacco Use   . Smoking status: Light Tobacco Smoker   . Smokeless tobacco: Never Used   Substance and Sexual Activity   . Alcohol use: Yes     Comment: 15-25oz cans daily   . Drug use: Yes     Types: Cocaine, Marijuana     Comment: cocaine (1gm daily)   . Sexual activity: None   Lifestyle   . Physical activity     Days per week: None     Minutes per session: None   . Stress: None   Relationships   . Social Wellsite geologist on phone: None     Gets together: None     Attends religious service: None     Active member of club or organization: None     Attends meetings of clubs or organizations: None     Relationship status: None   . Intimate partner violence     Fear of current or ex  partner: None     Emotionally abused: None     Physically abused: None     Forced sexual activity: None   Other Topics Concern   . None   Social History Narrative   . None       Medications/Allergies     Previous Medications    BUSPIRONE HCL (BUSPAR PO)    Take by mouth     No Known Allergies     Physical Exam       ED Triage Vitals [04/03/19 1103]   BP Temp Temp Source Pulse Resp SpO2 Height Weight   (!) 160/110 98.9 F (37.2 C) Oral 76 18 97 % 5\' 8"  (1.727 m) 200 lb (90.7 kg)     Physical Exam  General: Well developed, well nourished.  Neurologic: A&Ox3.   HEENT: Normocephalic, atraumatic.  Neck: supple.   Eyes: Conjunctiva clear. Extra-occular eye movements intact.  Cardiac: Regular rate/rhythm, no murmurs/rubs/gallops.  Pulmonary: Clear to auscultation bilaterally. Non-labored breathing, speaks in full sentences.   Abdomen:Normal active bowel sounds present in all areas. Soft, non-tender to palpation. No rebound/rigidity/guarding.   Extremities: no edema.   Skin: warm/dry.  Psychiatric: Appropriate mood for chief complaint, cooperative with examination.      SCREENINGS           D   Labs:  Results for orders placed or performed during the hospital encounter of 04/03/19   CBC WITH AUTO DIFFERENTIAL   Result Value Ref Range    WBC 8.4 3.6 - 10.7 10*3/uL    RBC 4.65 4.40 - 5.90 10*6/uL    Hemoglobin 14.8 13.0 - 18.0 g/dL    Hematocrit 69.6 29.5 - 52.0 %    MCV 93.4 80.0 - 98.0 fL    MCH 31.9 26.0 - 34.0 pg    MCHC 34.1 32.0 - 36.0 %    RDW 13.7 11.5 - 14.5 %    Platelets 377 140 - 440 10*3/uL    MPV 6.5 (L) 7.4 - 10.4 fL    Granulocytes % 58.8 40.0 - 80.0 %    Lymphocyte % 27.8 20.0 - 40.0 %    Monocytes 11.1 (H) 2.0 - 10.0 %    Eosinophils 1.7 1.0 - 6.0 %    Basophils 0.6 0.0 - 2.0 %    Absolute Neut # 5.0 1.8 - 7.0 10*3/uL    Absolute Lymph # 2.3 1.0 - 4.3 10*3/uL    Absolute Mono # 0.9 (H) 0.0 - 0.8 10*3/uL    Absolute Eos # 0.1 0.0 - 0.5 10*3/uL    Absolute Baso # 0.0 0.0 - 0.2 10*3/uL   Basic Metabolic Panel   Result Value Ref Range    Sodium 143 135 - 145 mmol/L    Potassium 4.0 3.5 - 5.1 mmol/L    Chloride 107 98 - 107 mmol/L    CO2 28 22 - 30 mmol/L    Anion Gap 8 NA    Glucose 88 70 - 100 mg/dL    BUN 9 7 - 20 mg/dL    CREATININE 2.84 1.32 - 1.25 mg/dL    eGFR African American >90.0 >60 mL/min    EGFR IF NonAfrican American >90.0 >60 mL/min    Calcium 9.3 8.4 - 10.4 mg/dL   Urine Drug Screen   Result Value Ref Range    Amphetamines, urine Negative NA    Barbiturates, Ur Negative NA    Benzodiazepine Ur Qual Negative NA    Cocaine Metabolites, Ur Positive NA  Methadone, Urine Negative NA    Opiates, Urine Negative NA    Oxycodone Screen, Ur Negative NA    PCP, Urine Negative NA   Ethanol   Result Value Ref Range    Ethanol Lvl <0.010 0.000 - 0.010 g/dL   Hepatic Function Panel   Result Value Ref Range    Albumin,Serum 4.3 3.5 - 5.0 g/dL    Total Protein 7.8 6.3 - 8.2 g/dL    Total Bilirubin 0.6 0.2 - 1.3 mg/dL    Bilirubin, Direct 0.0 0.0 - 0.3 mg/dL    Alkaline Phosphatase 68 38 - 126 U/L    ALT 36 0 - 49 U/L    AST 95 (H) 15 - 46 U/L   COVID-19    Specimen:  Other    -NASOPHARYNGEAL   Result Value Ref Range    SARS-CoV-2 DETECTED (A) Not Detected     Radiographs:  No results found.      EKG: All EKG's areinterpreted by the Emergency Department Physician in the absence of a cardiologist. see their note for interpretation of EKG.    ED Course and MDM           In brief, Johntae Harer isa 48 y.o. male who presented to the emergency department, requesting alcohol detox.     Patient on exam is well-appearing, he is not acutely distressed, blood pressure noted to be elevated, reports a history of hypertension, not currently compliant with medications. Have discussed with attending physician and detox, who requests that we initiate blood pressure medications. He is not having any signs of hypertensive emergency bowel but will reinitiate him on oral medication, will start with Norvasc and clonidine. He did not phenobarbital for prevention of withdrawal symptoms.    Patient's screening labs indicate negative alcohol level, drug screen positive for cocaine. No significant electrolyte abnormality, renal insufficiency, no anemia, no leukocytosis. AST noted to be 95, ALT normal limits. He is found to be positive for Covid 19, did not have any symptoms of fevers, chills, cough, congestion or additional symptoms. Patient unable to go to [facility] detox due to this, I have discussed this case with Dr. Dorian Furnace with [facility], who has agreed to accept this patient on H5.  Patient will then go back to rehabilitation at [facility] afterwards, he has given me permission to update Lauren with the facility that he is being admitted to the hospital that they can arrange for him to come back to rehabilitation after discharge.    ED Medication Orders (From admission, onward)    Start Ordered     Status Ordering Provider    04/03/19 1245 04/03/19 1244  cloNIDine (CATAPRES) tablet 0.1 mg  ONCE      Last MAR action: Given - by Dellia Nims L. on 04/03/19 at 1306 Jobe Mutch A    04/03/19 1245  04/03/19 1244  amLODIPine (NORVASC) tablet 5 mg  ONCE      Last MAR action: Given - by Dellia Nims L. on 04/03/19 at 1306 Jamieson Hetland A    04/03/19 1215 04/03/19 1210  PHENobarbital (LUMINAL) tablet 97.2 mg  ONCE      Last MAR action: Given - by Dellia Nims L. on 04/03/19 at 1220 Tanya Crothers A          Final Impression      1. Alcohol dependence with unspecified alcohol-induced disorder (HCC)    2. COVID-19        DISPOSITION Admitted 04/03/2019 02:27:24 PM     (  Please note that portions of this note may have been completed with a voice recognition program. Efforts were made to edit the dictations but occasionally words aremis-transcribed.)    Sherwood Gambler, PA-C  [employer] [facility] [employer]        Sherwood Gambler, PA-C  04/03/19 1432

## 2019-04-03 NOTE — ED Notes (Signed)
Spoke with [employer] with [facility] (rehab). [phone]  Requires a release of info when pt gets to [facility] detox.     Kamiya Acord L. Alycia Rossetti, RN  04/03/19 1309

## 2019-04-04 LAB — CBC WITH AUTO DIFFERENTIAL
Absolute Baso #: 0 10*3/uL (ref 0.0–0.2)
Absolute Eos #: 0.2 10*3/uL (ref 0.0–0.5)
Absolute Lymph #: 2.8 10*3/uL (ref 1.0–4.3)
Absolute Mono #: 0.7 10*3/uL (ref 0.0–0.8)
Absolute Neut #: 3.3 10*3/uL (ref 1.8–7.0)
Basophils: 0.5 % (ref 0.0–2.0)
Eosinophils: 2.5 % (ref 1.0–6.0)
Granulocytes %: 47.6 % (ref 40.0–80.0)
Hematocrit: 40.9 % (ref 40.0–52.0)
Hemoglobin: 14.1 g/dL (ref 13.0–18.0)
Lymphocyte %: 39.9 % (ref 20.0–40.0)
MCH: 31.9 pg (ref 26.0–34.0)
MCHC: 34.4 % (ref 32.0–36.0)
MCV: 92.7 fL (ref 80.0–98.0)
MPV: 7.2 fL — ABNORMAL LOW (ref 7.4–10.4)
Monocytes: 9.5 % (ref 2.0–10.0)
Platelets: 361 10*3/uL (ref 140–440)
RBC: 4.42 10*6/uL (ref 4.40–5.90)
RDW: 13.7 % (ref 11.5–14.5)
WBC: 6.9 10*3/uL (ref 3.6–10.7)

## 2019-04-04 LAB — COMPREHENSIVE METABOLIC PANEL W/ REFLEX TO MG FOR LOW K
ALT: 25 U/L (ref 0–49)
AST: 55 U/L — ABNORMAL HIGH (ref 15–46)
Albumin,Serum: 3.5 g/dL (ref 3.5–5.0)
Alkaline Phosphatase: 53 U/L (ref 38–126)
Anion Gap: 7 NA
BUN: 9 mg/dL (ref 7–20)
CO2: 25 mmol/L (ref 22–30)
Calcium: 8.9 mg/dL (ref 8.4–10.4)
Chloride: 105 mmol/L (ref 98–107)
Creatinine: 0.93 mg/dL (ref 0.52–1.25)
EGFR IF NonAfrican American: >90 mL/min (ref >60)
Glucose: 90 mg/dL (ref 70–100)
Potassium: 3.7 mmol/L (ref 3.5–5.1)
Sodium: 137 mmol/L (ref 135–145)
Total Bilirubin: 0.7 mg/dL (ref 0.2–1.3)
Total Protein: 6.5 g/dL (ref 6.3–8.2)
eGFR African American: >90 mL/min (ref >60)

## 2019-04-04 LAB — LACTATE DEHYDROGENASE: LD: 194 U/L (ref 120–246)

## 2019-04-04 LAB — C-REACTIVE PROTEIN: CRP: <5 mg/L (ref 0.0–6.0)

## 2019-04-04 MED ORDER — FOLIC ACID 1 MG PO TABS
1 MG | Freq: Every day | ORAL | Status: DC
Start: 2019-04-04 — End: 2019-04-04

## 2019-04-04 MED ORDER — THERAPEUTIC MULTIVIT/MINERAL PO TABS
Freq: Every day | ORAL | Status: DC
Start: 2019-04-04 — End: 2019-04-08
  Administered 2019-04-04 – 2019-04-08 (×5): 1 via ORAL

## 2019-04-04 MED ORDER — BENZONATATE 100 MG PO CAPS
100 MG | Freq: Three times a day (TID) | ORAL | Status: DC | PRN
Start: 2019-04-04 — End: 2019-04-08

## 2019-04-04 MED ORDER — PROPRANOLOL HCL 10 MG PO TABS
10 MG | Freq: Three times a day (TID) | ORAL | Status: DC | PRN
Start: 2019-04-04 — End: 2019-04-08

## 2019-04-04 MED ORDER — BUSPIRONE HCL 10 MG PO TABS
10 MG | Freq: Three times a day (TID) | ORAL | Status: DC
Start: 2019-04-04 — End: 2019-04-08
  Administered 2019-04-04 – 2019-04-08 (×13): 10 mg via ORAL

## 2019-04-04 MED ORDER — THIAMINE MONONITRATE 100 MG PO TABS
100 MG | Freq: Three times a day (TID) | ORAL | Status: DC
Start: 2019-04-04 — End: 2019-04-04
  Administered 2019-04-04: 14:00:00 100 mg via ORAL

## 2019-04-04 MED ORDER — ARIPIPRAZOLE 10 MG PO TABS
10 MG | Freq: Every day | ORAL | Status: DC
Start: 2019-04-04 — End: 2019-04-08
  Administered 2019-04-04 – 2019-04-08 (×5): 10 mg via ORAL

## 2019-04-04 MED ORDER — PHENOBARBITAL 32.4 MG PO TABS
32.4 MG | ORAL | Status: DC
Start: 2019-04-04 — End: 2019-04-05
  Administered 2019-04-04 – 2019-04-05 (×5): 97.2 mg via ORAL

## 2019-04-04 MED ORDER — THIAMINE MONONITRATE 100 MG PO TABS
100 MG | Freq: Every day | ORAL | Status: DC
Start: 2019-04-04 — End: 2019-04-07
  Administered 2019-04-05 – 2019-04-07 (×3): 100 mg via ORAL

## 2019-04-04 MED ORDER — MIRTAZAPINE 15 MG PO TABS
15 MG | Freq: Every evening | ORAL | Status: DC
Start: 2019-04-04 — End: 2019-04-08
  Administered 2019-04-05 – 2019-04-08 (×4): 15 mg via ORAL

## 2019-04-04 MED ORDER — HYDROXYZINE HCL 10 MG PO TABS
10 MG | Freq: Three times a day (TID) | ORAL | Status: DC | PRN
Start: 2019-04-04 — End: 2019-04-08

## 2019-04-04 MED ORDER — ALBUTEROL SULFATE HFA 108 (90 BASE) MCG/ACT IN AERS
108 (90 Base) MCG/ACT | Freq: Four times a day (QID) | RESPIRATORY_TRACT | Status: DC | PRN
Start: 2019-04-04 — End: 2019-04-08
  Administered 2019-04-05: 20:00:00 2 via RESPIRATORY_TRACT

## 2019-04-04 MED FILL — MELATONIN 5 MG PO TABS: 5 mg | ORAL | Qty: 1

## 2019-04-04 MED FILL — THERA-M PO TABS: ORAL | Qty: 1

## 2019-04-04 MED FILL — PROPRANOLOL HCL 10 MG PO TABS: 10 mg | ORAL | Qty: 1

## 2019-04-04 MED FILL — PHENOBARBITAL SODIUM 65 MG/ML IJ SOLN: 65 mg/mL | INTRAMUSCULAR | Qty: 1

## 2019-04-04 MED FILL — ARIPIPRAZOLE 10 MG PO TABS: 10 mg | ORAL | Qty: 1

## 2019-04-04 MED FILL — PHENOBARBITAL 32.4 MG PO TABS: 32.4 mg | ORAL | Qty: 1

## 2019-04-04 MED FILL — ACETAMINOPHEN 325 MG PO TABS: 325 mg | ORAL | Qty: 2

## 2019-04-04 MED FILL — BUSPIRONE HCL 10 MG PO TABS: 10 mg | ORAL | Qty: 1

## 2019-04-04 MED FILL — FOLIC ACID 1 MG PO TABS: 1 mg | ORAL | Qty: 1

## 2019-04-04 MED FILL — ALBUTEROL SULFATE HFA 108 (90 BASE) MCG/ACT IN AERS: 108 (90 Base) MCG/ACT | RESPIRATORY_TRACT | Qty: 1.2

## 2019-04-04 MED FILL — ENOXAPARIN SODIUM 40 MG/0.4ML SC SOLN: 40 MG/0.4ML | SUBCUTANEOUS | Qty: 0.4

## 2019-04-04 MED FILL — THIAMINE MONONITRATE 100 MG PO TABS: 100 mg | ORAL | Qty: 1

## 2019-04-04 NOTE — Care Coordination-Inpatient (Signed)
Social/Functional History  Lives With: Alone(wife lives in [place])  Type of Home: Homeless  ADL Assistance: Independent  Homemaking Assistance: Independent  Homemaking Responsibilities: No  Ambulation Assistance: Independent  Transfer Assistance: Surveyor, minerals: No  Patient's Herbalist: has a Sports coach: Bus, Walk, Friends  Occupation: Unemployed        Building control surveyor  Current Residence: Homeless  Living Arrangements: Alone  Support Systems: Actuary Prior To Admission: None  Potential Assistance Needed: N/A  Photographer Medications: No  Meds-to-Beds: Does the patient want to have any new prescriptions delivered to bedside prior to discharge?: Yes  Patient expects to be discharged to:: discharge to community vs inpatient psych vs residential treatment program  Expected Discharge Date: 04/09/19  Follow Up Appointment: Best Day/Time : Monday AM      Emergency Contacts  Contact 1: Name: Benetta Spar  Contact 1: Number: [phone]  Contact 1: Relationship: Wife      Patient is homeless. Independent. No DME. Has family in [place]. Wife lives in [place]. Has a driver's license but no car. He is insured with [employer] and does not report any issues with rx coverage.    SW consult in place. Anticipate discharge to community vs inpatient psych vs residential treatment.    Electronically signed by Anselm Lis, RN on 04/04/19 at 9:07 AM EST

## 2019-04-04 NOTE — Consults (Addendum)
Charles Fuller is a 48 y.o. male  Chief Complaint   Patient presents with   ??? Addiction Problem     Pt presents to ED requesting rehad/detox from ETOH & cocaine. Pt left Rehab [facility]) about a week ago and continued to drink and do cocaine. Pt drinks approx 15-25oz cans beer daily with cocaine use daily.    I saw patient while wearing an N-95 surgical mask for the entire interview.  I did not touch the patient, maintained 2 meters distance, and utilized hand sanitizer on entry and upon exiting the room  Here for above, denies current SI, not presently psychotic.  Support and reassurance offered to patient during our interview today.  He is homeless, and divorcing wife of 22 years.  He is presently humble and charming interpersonally.  Denies current probation or parole.  Works as a Development worker, international aid, says he has not been consistent in receiving mental health treatment for bipolar disorder.  Denies present suicide attempt. No prior psychiatric admission in [place].    No current facility-administered medications on file prior to encounter.      Current Outpatient Medications on File Prior to Encounter   Medication Sig Dispense Refill   ??? busPIRone HCl (BUSPAR PO) Take by mouth       Current Facility-Administered Medications   Medication Dose Route Frequency Provider Last Rate Last Admin   ??? PHENobarbital (LUMINAL) tablet 97.2 mg  97.2 mg Oral Q4H Cleta Alberts Alderson, DO   97.2 mg at 04/04/19 1206   ??? thiamine mononitrate tablet 100 mg  100 mg Oral TID Cleta Alberts Alderson, DO   100 mg at 04/04/19 4259   ??? benzonatate (TESSALON) capsule 100 mg  100 mg Oral TID PRN Ronette Deter, PA-C       ??? albuterol sulfate HFA 108 (90 Base) MCG/ACT inhaler 2 puff  2 puff Inhalation Q6H PRN Ronette Deter, PA-C       ??? propranolol (INDERAL) tablet 10 mg  10 mg Oral TID PRN Cleta Alberts Alderson, DO       ??? busPIRone (BUSPAR) tablet 10 mg  10 mg Oral TID Cleta Alberts Alderson, DO       ??? ARIPiprazole (ABILIFY) tablet 10 mg  10 mg Oral Daily Kem Kays, MD       ??? mirtazapine (REMERON) tablet 15 mg  15 mg Oral Nightly Kem Kays, MD       ??? hydrOXYzine (ATARAX) tablet 10 mg  10 mg Oral TID PRN Kem Kays, MD       ??? thiamine mononitrate tablet 100 mg  100 mg Oral Daily Kem Kays, MD       ??? folic acid (FOLVITE) tablet 1 mg  1 mg Oral Daily Kem Kays, MD       ??? therapeutic multivitamin-minerals 1 tablet  1 tablet Oral Daily Kem Kays, MD       ??? acetaminophen (TYLENOL) tablet 650 mg  650 mg Oral Q6H PRN Ronette Deter, PA-C   650 mg at 04/03/19 2224    Or   ??? acetaminophen (TYLENOL) suppository 650 mg  650 mg Rectal Q6H PRN Ronette Deter, PA-C       ??? enoxaparin (LOVENOX) injection 40 mg  40 mg Subcutaneous Daily Ronette Deter, PA-C   40 mg at 04/04/19 1207   ??? melatonin tablet 5 mg  5 mg Oral Nightly PRN Ronette Deter, PA-C   5 mg at  09/81/19 1478   ??? folic acid (FOLVITE) tablet 1 mg  1 mg Oral Daily Ronette Deter, PA-C   1 mg at 04/04/19 2956   ??? polyethylene glycol (GLYCOLAX) packet 17 g  17 g Oral Daily PRN Ronette Deter, PA-C       ??? promethazine (PHENERGAN) tablet 12.5 mg  12.5 mg Oral Q6H PRN Ronette Deter, PA-C        Or   ??? ondansetron [facility]) injection 4 mg  4 mg Intravenous Q6H PRN Ronette Deter, PA-C       ??? sodium chloride flush 0.9 % injection 10 mL  10 mL Intravenous 2 times per day Ronette Deter, PA-C   3 mL at 04/04/19 0856   ??? sodium chloride flush 0.9 % injection 10 mL  10 mL Intravenous PRN Ronette Deter, PA-C         No Known Allergies  Vitals:    04/03/19 1514 04/03/19 1604 04/03/19 2038 04/04/19 0522   BP: 125/80 134/89 (!) 156/102 (!) 134/94   Pulse: 75 73 71 65   Resp: 18 16 18 18    Temp: 98 ??F (36.7 ??C) 97.9 ??F (36.6 ??C) 98.2 ??F (36.8 ??C) 98 ??F (36.7 ??C)   TempSrc: Oral Temporal Oral Temporal   SpO2: 94% 95% 98% 97%   Weight:       Height:         Social History     Tobacco Use   ??? Smoking status: Light Tobacco Smoker   ??? Smokeless tobacco: Never Used   Substance Use  Topics   ??? Alcohol use: Yes     Comment: 15-25oz cans daily   ??? Drug use: Yes     Types: Cocaine, Marijuana     Comment: cocaine (1gm daily)     Denies prior DTs or seizures from withdraawal.      [facility] report run, no findings    ROS negative for CP, SOB, cough, N/V/D, rash, headache.  No tremors presently (on phenobarbital).    Well developed, well nourished, no acute distress  Normal rate and content of speech  Behavior socially appropriate  Mood mildly depressed and anxious.  Affect is mood congruent  No suicidal ideation, no homicidal ideation  No psychosis or hallucinations reported or evident  Form and content of thought normal  No cognitive impairments, fund of knowledge intact  Normal gait  Abstracts well.  Insight and judgement intact    Recent Results (from the past 48 hour(s))   CBC WITH AUTO DIFFERENTIAL    Collection Time: 04/03/19 11:08 AM   Result Value Ref Range    WBC 8.4 3.6 - 10.7 10*3/uL    RBC 4.65 4.40 - 5.90 10*6/uL    Hemoglobin 14.8 13.0 - 18.0 g/dL    Hematocrit 43.4 40.0 - 52.0 %    MCV 93.4 80.0 - 98.0 fL    MCH 31.9 26.0 - 34.0 pg    MCHC 34.1 32.0 - 36.0 %    RDW 13.7 11.5 - 14.5 %    Platelets 377 140 - 440 10*3/uL    MPV 6.5 (L) 7.4 - 10.4 fL    Granulocytes % 58.8 40.0 - 80.0 %    Lymphocyte % 27.8 20.0 - 40.0 %    Monocytes 11.1 (H) 2.0 - 10.0 %    Eosinophils 1.7 1.0 - 6.0 %    Basophils 0.6 0.0 - 2.0 %    Absolute Neut # 5.0 1.8 -  7.0 10*3/uL    Absolute Lymph # 2.3 1.0 - 4.3 10*3/uL    Absolute Mono # 0.9 (H) 0.0 - 0.8 10*3/uL    Absolute Eos # 0.1 0.0 - 0.5 10*3/uL    Absolute Baso # 0.0 0.0 - 0.2 36*6/YQ   Basic Metabolic Panel    Collection Time: 04/03/19 11:08 AM   Result Value Ref Range    Sodium 143 135 - 145 mmol/L    Potassium 4.0 3.5 - 5.1 mmol/L    Chloride 107 98 - 107 mmol/L    CO2 28 22 - 30 mmol/L    Anion Gap 8 NA    Glucose 88 70 - 100 mg/dL    BUN 9 7 - 20 mg/dL    CREATININE 0.97 0.52 - 1.25 mg/dL    eGFR African American >90.0 >60 mL/min    EGFR IF NonAfrican  American >90.0 >60 mL/min    Calcium 9.3 8.4 - 10.4 mg/dL   Urine Drug Screen    Collection Time: 04/03/19 11:08 AM   Result Value Ref Range    Amphetamines, urine Negative NA    Barbiturates, Ur Negative NA    Benzodiazepine Ur Qual Negative NA    Cocaine Metabolites, Ur Positive NA    Methadone, Urine Negative NA    Opiates, Urine Negative NA    Oxycodone Screen, Ur Negative NA    PCP, Urine Negative NA   Ethanol    Collection Time: 04/03/19 11:08 AM   Result Value Ref Range    Ethanol Lvl <0.010 0.000 - 0.010 g/dL   Hepatic Function Panel    Collection Time: 04/03/19 11:08 AM   Result Value Ref Range    Albumin,Serum 4.3 3.5 - 5.0 g/dL    Total Protein 7.8 6.3 - 8.2 g/dL    Total Bilirubin 0.6 0.2 - 1.3 mg/dL    Bilirubin, Direct 0.0 0.0 - 0.3 mg/dL    Alkaline Phosphatase 68 38 - 126 U/L    ALT 36 0 - 49 U/L    AST 95 (H) 15 - 46 U/L   Ferritin    Collection Time: 04/03/19 11:08 AM   Result Value Ref Range    Ferritin 90 18 - 464 ng/mL   Lactate Dehydrogenase    Collection Time: 04/03/19 11:08 AM   Result Value Ref Range    LD 286 (H) 120 - 246 U/L   C-Reactive Protein    Collection Time: 04/03/19 11:08 AM   Result Value Ref Range    CRP <5.0 0.0 - 6.0 mg/L   COVID-19    Collection Time: 04/03/19 11:13 AM    Specimen: Other    -NASOPHARYNGEAL   Result Value Ref Range    SARS-CoV-2 DETECTED (A) Not Detected   D-Dimer, Quantitative    Collection Time: 04/03/19  3:15 PM   Result Value Ref Range    D-Dimer, Quant <0.19 0.00 - 0.50 mg/L   Procalcitonin    Collection Time: 04/03/19  3:48 PM   Result Value Ref Range    Procalcitonin <0.10 <0.10 ng/mL    Interpretation See Below NA   CBC Auto Differential    Collection Time: 04/04/19  5:25 AM   Result Value Ref Range    WBC 6.9 3.6 - 10.7 10*3/uL    RBC 4.42 4.40 - 5.90 10*6/uL    Hemoglobin 14.1 13.0 - 18.0 g/dL    Hematocrit 40.9 40.0 - 52.0 %    MCV 92.7 80.0 - 98.0 fL  MCH 31.9 26.0 - 34.0 pg    MCHC 34.4 32.0 - 36.0 %    RDW 13.7 11.5 - 14.5 %    Platelets 361  140 - 440 10*3/uL    MPV 7.2 (L) 7.4 - 10.4 fL    Granulocytes % 47.6 40.0 - 80.0 %    Lymphocyte % 39.9 20.0 - 40.0 %    Monocytes 9.5 2.0 - 10.0 %    Eosinophils 2.5 1.0 - 6.0 %    Basophils 0.5 0.0 - 2.0 %    Absolute Neut # 3.3 1.8 - 7.0 10*3/uL    Absolute Lymph # 2.8 1.0 - 4.3 10*3/uL    Absolute Mono # 0.7 0.0 - 0.8 10*3/uL    Absolute Eos # 0.2 0.0 - 0.5 10*3/uL    Absolute Baso # 0.0 0.0 - 0.2 10*3/uL   Comprehensive Metabolic Panel w/ Reflex to MG    Collection Time: 04/04/19  5:25 AM   Result Value Ref Range    Sodium 137 135 - 145 mmol/L    Potassium 3.7 3.5 - 5.1 mmol/L    Chloride 105 98 - 107 mmol/L    CO2 25 22 - 30 mmol/L    Anion Gap 7 NA    Glucose 90 70 - 100 mg/dL    BUN 9 7 - 20 mg/dL    CREATININE 0.93 0.52 - 1.25 mg/dL    eGFR African American >90.0 >60 mL/min    EGFR IF NonAfrican American >90.0 >60 mL/min    Calcium 8.9 8.4 - 10.4 mg/dL    Albumin,Serum 3.5 3.5 - 5.0 g/dL    Total Protein 6.5 6.3 - 8.2 g/dL    Total Bilirubin 0.7 0.2 - 1.3 mg/dL    Alkaline Phosphatase 53 38 - 126 U/L    ALT 25 0 - 49 U/L    AST 55 (H) 15 - 46 U/L   C-Reactive Protein    Collection Time: 04/04/19  5:25 AM   Result Value Ref Range    CRP <5.0 0.0 - 6.0 mg/L   Lactate Dehydrogenase    Collection Time: 04/04/19  5:25 AM   Result Value Ref Range    LD 194 120 - 246 U/L     Assessment    Bipolar disorder, most recent episode depressed    Cocaine use disorder  Alcohol use disorder    Plan     will restart medications (see orders)  Follow up with [facility] ([phone])when discharged.    Psychiatric admission not presently indicated.    Electronically signed by Kem Kays, MD on 04/04/2019 at 12:55 PM

## 2019-04-04 NOTE — Progress Notes (Signed)
Comprehensive Nutrition Assessment    Type and Reason for Visit:  Initial, Positive Nutrition Screen    Nutrition Recommendations/Plan:   1. Continue with General diet, encourage small frequent meals  2. Monitor need for oral nutritional supplement once tolerating po diet  3. Continue with MVI, thiamine and folic acid supplements  4. Recommend document po intake in nursing flow sheets  5. Obtain current weight if able. Admit weight is a stated weight and no weight history in epic  6. RD continue to monitor overall nutritional status and follow up weekly    Nutrition Assessment:  PMHx of anxiety, bipolar disorder, EtOH abuse who is coming in with request or detoxification.  Pt says that he was recently at a rehab facility and left due to worsening anxiety.  After leaving, about 7 days ago, he began drinking EtOH again, consuming 15 24 ounce cans of beer daily.  No hx of Currently he feels somewhat anxious but has no other complaints.  No hx of seizures or ICU admissions for withdrawal.  He has not had any SOB, fever, cough, or other COVID-like symptoms and had no known COVID contacts. He tested (+) here today. Per notes today,The patient complains of withdrawal. Reports +HA, shakes, cravings, dry mouth, nausea and poor appetite. Endorses anxiety and feeling depressed. States he had some SOB when getting back from the bathroom and has developed mild cough but no sputum production. RD called into room, patient currently not answering phone.    Malnutrition Assessment:  Malnutrition Status:  Insufficient data    Context:  Social/Environmental Circumstances     Findings of the 6 clinical characteristics of malnutrition:  Energy Intake:  Mild decrease in energy intake (Comment)  Weight Loss:  Unable to assess no weight history in epic  Body Fat Loss:  Unable to assess     Muscle Mass Loss:  Unable to assess    Fluid Accumulation:  Unable to assess    Grip Strength:  Not Performed    Estimated Daily Nutrient  Needs:  Energy (kcal):  1750-2100 (25-30); Weight Used for Energy Requirements:  Ideal     Protein (g):  70-84 (1.0-1.2); Weight Used for Protein Requirements:  Ideal        Fluid (ml/day):  per MD;     Nutrition Related Findings:  +bowel sounds; +nausea; no edema noted; Braden 20      Wounds:  None       Anthropometric Measures:  ?? Height: 5\' 8"  (172.7 cm)  ?? Current Body Weight:   none  ?? Admission Body Weight: 200 lb (90.7 kg)(stated)    ?? Usual Body Weight: (no weight history in epic)     ?? Ideal Body Weight: 154 lbs;  ?? Adjusted Body Weight:  ; No Adjustment   ?? BMI Categories: Obese Class 1 (BMI 30.0-34.9)       Nutrition Diagnosis:   ?? Predicted inadequate energy intake related to psychological cause or life stress as evidenced by nausea    Nutrition Interventions:   Food and/or Nutrient Delivery:  Continue Current Diet  Coordination of Nutrition Care:  Continue to monitor while inpatient    Goals:  Patient consume >75% meals and tolerate       Nutrition Monitoring and Evaluation:   Food/Nutrient Intake Outcomes:  Food and Nutrient Intake, Vitamin/Mineral Intake  Physical Signs/Symptoms Outcomes:  Biochemical Data, GI Status, Nausea or Vomiting, Fluid Status or Edema, Weight, Skin     Electronically signed by [id number],  MS, RD, LD on 04/04/19 at 1:54 PM EST    Contact: pager [phone]

## 2019-04-04 NOTE — Consults (Signed)
Addiction Medicine  Consultation   H&P           Patient: Charles Fuller     MRN: [id number]     Admit Date: 04/03/2019     Primary Care Physician: No primary care provider on file.    Reason for Consultation: "alcohol withdrawal."    Chief Complaint   Patient presents with   ??? Addiction Problem     Pt presents to ED requesting rehad/detox from ETOH & cocaine. Pt left [facility]hab [facility]) about a week ago and continued to drink and do cocaine. Pt drinks approx 15-25oz cans beer daily with cocaine use daily.         History of Present Illness     Charles Fuller is a 48 y.o. year old hispanic male with a PMH of bipolar disorder, anxiety, and a long history of polysubstance use disorder (alcohol, cocaine). He was in residential treatment at [facility] but left 7 days ago due to severe anxiety and started drinking and using cocaine again. Has been drinking 15 24 oz beers daily. Last drink yesterday at 5am. ETOH negative on admission. UDS positive for cocaine. Also admitted in ED to taking Percocet for back pain but does not have a prescription. [facility] willing to take him back but recommended that he go to the hospital for detox first. Happened to test positive for COVID so was admitted to [facility].     Thinks that a big part of his relapse was not being on his medications for anxiety and depression (one of which was buspar, which he hasn't taken for past 67m.      [place]. He is currently homeless and intermittently living on the street.     Current Substance Use:   [x]  Alcohol:  15 24oz beers daily X 7 days .  []  Amphetamines: denies.  []  Benzos: denies.  [x]  Cocaine: 1g daily X 7 days.  []  Hallucinogens: denies.  []  Marijuana: denies.  [x]  Nicotine: light smoker.  [x]  Opioids: takes occasional percocet for back pain?    Substance Use History:  Consequences:  []  IVDA.  [x]  Blackouts related to substance use.  []  History of  withdrawal seizures.  []  History of delirium tremens.  []  History of overdoses.  []  Legal consequences of substance use.    Substance Use Disorder Criteria:  2-3 = mild; 4-5 = moderate; 6 or >6 = severe substance use disorder   [x]  Taking substance in larger amounts and/or for longer than intended.  [x]  Wanting to cut down or quit but not being able to.  []  Spending a lot of time obtaining the substance.  [x]  Craving or a strong desire to use substance.  [x]  Repeatedly doesn't carry out major obligations due to substance use.  [x]  Using despite recurring social or interpersonal problems.  [x]  Reducing social, occupational, or recreational activities.  [x]  Recurrent use in physically hazardous situations.  [x]  Consistent use despite recurrent physical or psychological difficulties.  [x]  Tolerance (increased amounts to achieve intoxication or diminished effect).  [x]  Withdrawal syndrome or the substance is used to avoid withdrawal.    Treatment History:   ??? Longest period of sobriety since daily use began is unk.  [x]  [facility]: [facility]  []  Chem Dep IOP: unk.  [x]  Detoxifications: multiple.  [x]  12 Step Meetings: yes in past.  [x]  Medication Assisted Treatment: denies.  Psychiatric History:  ??? Current Psychiatrist: none, just moved back to [place] from [place]  ??? Current Medications: none  ??? Previous Medication Trials: buspar  ??? Diagnoses: anxiety and bipolar d/o per chart  ??? Psychiatric Hospitalizations: unk  ??? Previous Suicide Attempts: unk  ??? Adverse Childhood Events: unk  ??? Trauma History: unk  ??? History of Head Injuries: unk    Remaining History:  Social History     Socioeconomic History   ??? Marital status: Married     Spouse name: Not on file   ??? Number of children: Not on file   ??? Years of education: Not on file   ??? Highest education level: Not on file   Occupational History   ??? Not on file   Social Needs   ??? Financial resource strain: Not on file   ??? Food insecurity     Worry: Not on file      Inability: Not on file   ??? Transportation needs     Medical: Not on file     Non-medical: Not on file   Tobacco Use   ??? Smoking status: Light Tobacco Smoker   ??? Smokeless tobacco: Never Used   Substance and Sexual Activity   ??? Alcohol use: Yes     Comment: 15-25oz cans daily   ??? Drug use: Yes     Types: Cocaine, Marijuana     Comment: cocaine (1gm daily)   ??? Sexual activity: Not on file   Lifestyle   ??? Physical activity     Days per week: Not on file     Minutes per session: Not on file   ??? Stress: Not on file   Relationships   ??? Social Product manager on phone: Not on file     Gets together: Not on file     Attends religious service: Not on file     Active member of club or organization: Not on file     Attends meetings of clubs or organizations: Not on file     Relationship status: Not on file   ??? Intimate partner violence     Fear of current or ex partner: Not on file     Emotionally abused: Not on file     Physically abused: Not on file     Forced sexual activity: Not on file   Other Topics Concern   ??? Not on file   Social History Narrative   ??? Not on file     Past Medical History:   Diagnosis Date   ??? Anxiety    ??? Bipolar 1 disorder ([place])    ??? Depression      History reviewed. No pertinent surgical history.  History reviewed. No pertinent family history.   Review of Systems     Review of Systems   Constitutional: Positive for activity change, appetite change, diaphoresis and fatigue.   Respiratory: Negative for chest tightness and shortness of breath.    Cardiovascular: Negative for chest pain and palpitations.   Gastrointestinal: Negative for abdominal pain, diarrhea, nausea and vomiting.   Endocrine: Negative for cold intolerance and heat intolerance.   Musculoskeletal: Negative for myalgias.   Skin: Negative for color change, pallor, rash and wound.   Neurological: Positive for tremors and weakness.   Psychiatric/Behavioral: Negative for agitation, behavioral problems, hallucinations and suicidal  ideas. The patient is nervous/anxious.       Physicial Exam     Vitals:    04/03/19  [place] 04/03/19 1604 04/03/19 2038 04/04/19 0522   BP: 125/80 134/89 (!) 156/102 (!) 134/94   Pulse: 75 73 71 65   Resp: 18 16 18 18    Temp: 98 ??F (36.7 ??C) 97.9 ??F (36.6 ??C) 98.2 ??F (36.8 ??C) 98 ??F (36.7 ??C)   TempSrc: Oral Temporal Oral Temporal   SpO2: 94% 95% 98% 97%   Weight:       Height:         Physical Exam   Constitutional: He is oriented to person, place, and time. No distress.   HENT:   Head: Normocephalic and atraumatic.   Eyes: No scleral icterus.   Neck: No JVD present.   Cardiovascular: Normal rate and regular rhythm.   Pulmonary/Chest: Effort normal. No respiratory distress.   Abdominal: He exhibits no distension. There is no guarding.   Musculoskeletal:         General: No deformity or edema.   Neurological: He is alert and oriented to person, place, and time.   Skin: No rash noted. He is diaphoretic. No erythema. No pallor.   Psychiatric: He has a normal mood and affect. His behavior is normal. Judgment and thought content normal.      Imaging     Xr Chest Portable    Result Date: 04/03/2019  Patient Name:                AMONTE, BROOKOVER MRN:                         [id number] Acct#:                       [id number] Diagnostic Radiology ACCESSION                 EXAM [place] [id number]             04/03/2019 16:12 EST     CR Chest Portable         Anell Barr CPT code [id number] Reason For Exam (CR Chest Portable) COVID Report CHEST: CLINICAL INDICATION: COVID-19 infection TECHNIQUE: AP portable chest COMPARISON:  None FINDINGS:  The cardiac and mediastinal silhouettes are normal. The lungs are clear. There is no sizable pleural effusion. Post surgical changes in the lower cervical spine. IMPRESSION: No acute process. Report Dictated on Workstation: [id number] ---** Final ---** Dictated: 04/03/2019 3:31 pm Dictating Physician: Adair Laundry, MD, NICHOLAS Signed  Date and Time: 04/03/2019 3:32 pm Signed by: Adair Laundry, MD, NICHOLAS Transcribed Date and Time: 04/03/2019 3:31       Labs     Recent Results (from the past 48 hour(s))   CBC WITH AUTO DIFFERENTIAL    Collection Time: 04/03/19 11:08 AM   Result Value Ref Range    WBC 8.4 3.6 - 10.7 10*3/uL    RBC 4.65 4.40 - 5.90 10*6/uL    Hemoglobin 14.8 13.0 - 18.0 g/dL    Hematocrit 43.4 40.0 - 52.0 %    MCV 93.4 80.0 - 98.0 fL    MCH 31.9 26.0 - 34.0 pg    MCHC 34.1 32.0 - 36.0 %    RDW 13.7 11.5 - 14.5 %    Platelets 377 140 - 440 10*3/uL    MPV 6.5 (L) 7.4 - 10.4 fL  Granulocytes % 58.8 40.0 - 80.0 %    Lymphocyte % 27.8 20.0 - 40.0 %    Monocytes 11.1 (H) 2.0 - 10.0 %    Eosinophils 1.7 1.0 - 6.0 %    Basophils 0.6 0.0 - 2.0 %    Absolute Neut # 5.0 1.8 - 7.0 10*3/uL    Absolute Lymph # 2.3 1.0 - 4.3 10*3/uL    Absolute Mono # 0.9 (H) 0.0 - 0.8 10*3/uL    Absolute Eos # 0.1 0.0 - 0.5 10*3/uL    Absolute Baso # 0.0 0.0 - 0.2 15*1/VO   Basic Metabolic Panel    Collection Time: 04/03/19 11:08 AM   Result Value Ref Range    Sodium 143 135 - 145 mmol/L    Potassium 4.0 3.5 - 5.1 mmol/L    Chloride 107 98 - 107 mmol/L    CO2 28 22 - 30 mmol/L    Anion Gap 8 NA    Glucose 88 70 - 100 mg/dL    BUN 9 7 - 20 mg/dL    CREATININE 0.97 0.52 - 1.25 mg/dL    eGFR African American >90.0 >60 mL/min    EGFR IF NonAfrican American >90.0 >60 mL/min    Calcium 9.3 8.4 - 10.4 mg/dL   Urine Drug Screen    Collection Time: 04/03/19 11:08 AM   Result Value Ref Range    Amphetamines, urine Negative NA    Barbiturates, Ur Negative NA    Benzodiazepine Ur Qual Negative NA    Cocaine Metabolites, Ur Positive NA    Methadone, Urine Negative NA    Opiates, Urine Negative NA    Oxycodone Screen, Ur Negative NA    PCP, Urine Negative NA   Ethanol    Collection Time: 04/03/19 11:08 AM   Result Value Ref Range    Ethanol Lvl <0.010 0.000 - 0.010 g/dL   Hepatic Function Panel    Collection Time: 04/03/19 11:08 AM   Result Value Ref Range    Albumin,Serum 4.3  3.5 - 5.0 g/dL    Total Protein 7.8 6.3 - 8.2 g/dL    Total Bilirubin 0.6 0.2 - 1.3 mg/dL    Bilirubin, Direct 0.0 0.0 - 0.3 mg/dL    Alkaline Phosphatase 68 38 - 126 U/L    ALT 36 0 - 49 U/L    AST 95 (H) 15 - 46 U/L   Ferritin    Collection Time: 04/03/19 11:08 AM   Result Value Ref Range    Ferritin 90 18 - 464 ng/mL   Lactate Dehydrogenase    Collection Time: 04/03/19 11:08 AM   Result Value Ref Range    LD 286 (H) 120 - 246 U/L   C-Reactive Protein    Collection Time: 04/03/19 11:08 AM   Result Value Ref Range    CRP <5.0 0.0 - 6.0 mg/L   COVID-19    Collection Time: 04/03/19 11:13 AM    Specimen: Other    -NASOPHARYNGEAL   Result Value Ref Range    SARS-CoV-2 DETECTED (A) Not Detected   D-Dimer, Quantitative    Collection Time: 04/03/19  3:15 PM   Result Value Ref Range    D-Dimer, Quant <0.19 0.00 - 0.50 mg/L   Procalcitonin    Collection Time: 04/03/19  3:48 PM   Result Value Ref Range    Procalcitonin <0.10 <0.10 ng/mL    Interpretation See Below NA   CBC Auto Differential    Collection Time: 04/04/19  5:25  AM   Result Value Ref Range    WBC 6.9 3.6 - 10.7 10*3/uL    RBC 4.42 4.40 - 5.90 10*6/uL    Hemoglobin 14.1 13.0 - 18.0 g/dL    Hematocrit 40.9 40.0 - 52.0 %    MCV 92.7 80.0 - 98.0 fL    MCH 31.9 26.0 - 34.0 pg    MCHC 34.4 32.0 - 36.0 %    RDW 13.7 11.5 - 14.5 %    Platelets 361 140 - 440 10*3/uL    MPV 7.2 (L) 7.4 - 10.4 fL    Granulocytes % 47.6 40.0 - 80.0 %    Lymphocyte % 39.9 20.0 - 40.0 %    Monocytes 9.5 2.0 - 10.0 %    Eosinophils 2.5 1.0 - 6.0 %    Basophils 0.5 0.0 - 2.0 %    Absolute Neut # 3.3 1.8 - 7.0 10*3/uL    Absolute Lymph # 2.8 1.0 - 4.3 10*3/uL    Absolute Mono # 0.7 0.0 - 0.8 10*3/uL    Absolute Eos # 0.2 0.0 - 0.5 10*3/uL    Absolute Baso # 0.0 0.0 - 0.2 10*3/uL   Comprehensive Metabolic Panel w/ Reflex to MG    Collection Time: 04/04/19  5:25 AM   Result Value Ref Range    Sodium 137 135 - 145 mmol/L    Potassium 3.7 3.5 - 5.1 mmol/L    Chloride 105 98 - 107 mmol/L    CO2 25 22  - 30 mmol/L    Anion Gap 7 NA    Glucose 90 70 - 100 mg/dL    BUN 9 7 - 20 mg/dL    CREATININE 0.93 0.52 - 1.25 mg/dL    eGFR African American >90.0 >60 mL/min    EGFR IF NonAfrican American >90.0 >60 mL/min    Calcium 8.9 8.4 - 10.4 mg/dL    Albumin,Serum 3.5 3.5 - 5.0 g/dL    Total Protein 6.5 6.3 - 8.2 g/dL    Total Bilirubin 0.7 0.2 - 1.3 mg/dL    Alkaline Phosphatase 53 38 - 126 U/L    ALT 25 0 - 49 U/L    AST 55 (H) 15 - 46 U/L   C-Reactive Protein    Collection Time: 04/04/19  5:25 AM   Result Value Ref Range    CRP <5.0 0.0 - 6.0 mg/L   Lactate Dehydrogenase    Collection Time: 04/04/19  5:25 AM   Result Value Ref Range    LD 194 120 - 246 U/L        Medications   Medications Prior to Admission: busPIRone HCl (BUSPAR PO), Take by mouth    ??? PHENobarbital  97.2 mg Oral Q4H   ??? thiamine mononitrate  100 mg Oral TID   ??? enoxaparin  40 mg Subcutaneous Daily   ??? folic acid  1 mg Oral Daily   ??? sodium chloride flush  10 mL Intravenous 2 times per day     benzonatate, albuterol sulfate HFA, acetaminophen **OR** acetaminophen, melatonin, polyethylene glycol, promethazine **OR** ondansetron, sodium chloride flush     Assessment/Plan     1. Severe alcohol use disorder with severe acute withdrawals  - Day 1 of no use   - phenobarbital taper starting at 97.38m q4  - high dose thiamine   - CIWA scores per unit     2. Anxiety and bipolar d/o hx per chart documentation  - start buspar 121mTID    -  propranolol PRN anxiety with holding parameters  - consider psychiatry consult for med management     Recovery plan: Patient to return to [facility] post-dc. Spoke with [facility] and they are willing to accept him. Will have to quarantine X 10 days due to COVID + test which is an issue because patient is homeless and has nowhere to go in the interim. [facility] suggested that we do a rapid COVID test on patient in 3-4 days and if he tests negative, he can go directly there. If not, will  have to find a place to stay for the remainder of his quarantine. SW consulted for assistance.     Remaining medical management per primary team.    Thank you for this consultation.     Will follow.    Loura Back, MD  Addiction Medicine  04/04/2019 at 12:22 PM

## 2019-04-04 NOTE — Consults (Signed)
Social worker discussed patient in rounds - currently homeless, here for detox, requesting residential treatment program at dc. Per notes, patient will need to quarantine for 10 days prior to admission to [facility] or have a negative COVID test (this would leave patient in hospital until next Thursday or Friday). Only one male shelter in the area, [facility], but not accepting COVID positive patients as there is no way for shelter to quarantine individuals at facility. Reached out to [facility] to determine if they may be able to assist, left message for [facility] requesting return call. Electronically signed by Fayrene Fearing, LSW on 04/04/19 at 3:20 PM EST

## 2019-04-04 NOTE — Progress Notes (Signed)
[facility] Progress Note        Charles Fuller  DOB:  12-10-1970(48 y.o.)  MRN:  [id number]    Date: 04/04/2019 7:29 AM  Subjective:    HPI  The patient complains of withdrawal. Reports +HA, shakes, cravings, dry mouth, nausea and poor appetite. Endorses anxiety and feeling depressed. States he had some SOB when getting back from the bathroom and has developed mild cough but no sputum production.   Hoping to go to residential treatment after d/c from hospital.     Scheduled Meds:  ??? PHENobarbital  97.2 mg Oral Q4H   ??? thiamine mononitrate  100 mg Oral TID   ??? enoxaparin  40 mg Subcutaneous Daily   ??? folic acid  1 mg Oral Daily   ??? sodium chloride flush  10 mL Intravenous 2 times per day     Continuous Infusions:  PRN Meds:acetaminophen **OR** acetaminophen, melatonin, polyethylene glycol, promethazine **OR** ondansetron, sodium chloride flush    Review of Systems   Constitutional: Positive for appetite change and fatigue. Negative for activity change, chills, diaphoresis and fever.   Respiratory: Positive for cough and shortness of breath.    Cardiovascular: Negative for chest pain and leg swelling.   Gastrointestinal: Negative for abdominal pain, constipation, diarrhea, nausea and vomiting.   Genitourinary: Negative for dysuria.   Musculoskeletal: Positive for back pain. Negative for arthralgias.   Neurological: Positive for headaches. Negative for dizziness and weakness.   Psychiatric/Behavioral: Positive for dysphoric mood. The patient is nervous/anxious.      ROS was obtained by phone.    Interval Pertinent History:  Social History     Tobacco Use   ??? Smoking status: Light Tobacco Smoker   ??? Smokeless tobacco: Never Used   Substance Use Topics   ??? Alcohol use: Yes     Comment: 15-25oz cans daily       Objective:     Patient Vitals for the past 24 hrs:   BP Temp Temp src Pulse Resp SpO2 Height Weight   04/04/19 0522 (!) 134/94 98 ??F (36.7 ??C) Temporal 65 18 97 % -- --   04/03/19 2038 (!) 156/102 98.2 ??F  (36.8 ??C) Oral 71 18 98 % -- --   04/03/19 1604 134/89 97.9 ??F (36.6 ??C) Temporal 73 16 95 % -- --   04/03/19 1514 125/80 98 ??F (36.7 ??C) Oral 75 18 94 % -- --   04/03/19 1410 (!) 132/93 98.3 ??F (36.8 ??C) -- 87 18 99 % -- --   04/03/19 1306 (!) 176/100 -- -- -- -- -- -- --   04/03/19 1217 (!) 176/100 -- -- 72 18 99 % -- --   04/03/19 1103 (!) 160/110 98.9 ??F (37.2 ??C) Oral 76 18 97 % 5\' 8"  (1.727 m) 200 lb (90.7 kg)       Average, Min, and Max for last 24 hours Vitals:  TEMPERATURE:  Temp  Avg: 98.2 ??F (36.8 ??C)  Min: 97.9 ??F (36.6 ??C)  Max: 98.9 ??F (37.2 ??C)  RESPIRATIONS RANGE: Resp  Avg: 17.7  Min: 16  Max: 18  PULSE RANGE: Pulse  Avg: 74.1  Min: 65  Max: 87  BLOOD PRESSURE RANGE:  Systolic (24hrs), Avg:149 , Min:125 , Max:176   ; Diastolic (24hrs), Avg:96, Min:80, Max:110  PULSE OXIMETRY RANGE: SpO2  Avg: 97 %  Min: 94 %  Max: 99 %  No intake/output data recorded.    Due to the current efforts to prevent transmission of COVID-19  and also the need to preserve PPE for other caregivers, a face-to-face encounter with the patient was not performed. That being said, all relevant records and diagnostic tests were reviewed, including laboratory results and imaging. Please refer to physician attestation for full physical exam findings.     Lab Results   Component Value Date    WBC 6.9 04/04/2019    HGB 14.1 04/04/2019    HCT 40.9 04/04/2019    MCV 92.7 04/04/2019    PLT 361 04/04/2019     Lab Results   Component Value Date    NA 137 04/04/2019    K 3.7 04/04/2019    CL 105 04/04/2019    CO2 25 04/04/2019    BUN 9 04/04/2019    CREATININE 0.93 04/04/2019    GLUCOSE 90 04/04/2019    CALCIUM 8.9 04/04/2019        No results found for: LABA1C   Additional results of the last 24 hours have been reviewed.    Assessment and Plan:         Active Problems:    Alcohol dependence, continuous ([place])    COVID-19    Anxiety  Resolved Problems:    * No resolved hospital problems. *    Full code  ??  #Alcohol and cocaine abuse/detox    -Utox positive for cocaine, EtOH level negative  -ADM consult, phenobarb 97.2 q 4h  -thiamine, folic acid supplements, CIWA, seizure precautions  ??  #Novel CoronaVirus Infection (COVID-19) [U07.1]  -COVID positive 12/29-isolation until 04/12/2019 , asymptomatic-low inflammatory markers, suspect may be false positive? Tested negative 12/17, negative inflammatory markers, CXR and procal  -Respiratory status on RA-Remdesivir/steroids not indicated as no hypoxia  -no covid directed therapies needed at this time   ??  #Anxiety/depression/Bipolar Disorder- will consult psych for medication management and hold off on restarting Buspar at this time    ??[name]    Code Status: Full code  DVTProphylaxis: lovenox 40 q 24hr - creatinine clearance >30    Disposition:await test results, await consultant recommendations, await clinical improvement and anticipate discharge 1-2d    I spent over 51% of total time providing counseling or incoordination of care: > 35 minutes   discussed with nurse, discussed with TCC/SW, patient and family updated and I personally reviewed chart, data, labs radiology reports    7AM-4PM Please Perfect serve Charles Drone, PA-C or page: [phone]  Electronically signed by Charm Rings, PA-C on 04/04/2019 at 7:29 AM    6PM-6AM please page:  [facility] Internal Medicine

## 2019-04-05 LAB — COMPREHENSIVE METABOLIC PANEL W/ REFLEX TO MG FOR LOW K
ALT: 21 U/L (ref 0–49)
AST: 37 U/L (ref 15–46)
Albumin,Serum: 3.2 g/dL — ABNORMAL LOW (ref 3.5–5.0)
Alkaline Phosphatase: 53 U/L (ref 38–126)
Anion Gap: 7 NA
BUN: 12 mg/dL (ref 7–20)
CO2: 24 mmol/L (ref 22–30)
Calcium: 8.5 mg/dL (ref 8.4–10.4)
Chloride: 107 mmol/L (ref 98–107)
Creatinine: 0.98 mg/dL (ref 0.52–1.25)
EGFR IF NonAfrican American: 89.9 mL/min (ref >60)
Glucose: 96 mg/dL (ref 70–100)
Potassium: 3.8 mmol/L (ref 3.5–5.1)
Sodium: 138 mmol/L (ref 135–145)
Total Bilirubin: 0.3 mg/dL (ref 0.2–1.3)
Total Protein: 6.1 g/dL — ABNORMAL LOW (ref 6.3–8.2)
eGFR African American: >90 mL/min (ref >60)

## 2019-04-05 LAB — CBC WITH AUTO DIFFERENTIAL
Absolute Baso #: 0 10*3/uL (ref 0.0–0.2)
Absolute Eos #: 0.2 10*3/uL (ref 0.0–0.5)
Absolute Lymph #: 3.4 10*3/uL (ref 1.0–4.3)
Absolute Mono #: 0.8 10*3/uL (ref 0.0–0.8)
Absolute Neut #: 4.7 10*3/uL (ref 1.8–7.0)
Basophils: 0.3 % (ref 0.0–2.0)
Eosinophils: 2.1 % (ref 1.0–6.0)
Granulocytes %: 51.2 % (ref 40.0–80.0)
Hematocrit: 41.6 % (ref 40.0–52.0)
Hemoglobin: 13.9 g/dL (ref 13.0–18.0)
Lymphocyte %: 37.1 % (ref 20.0–40.0)
MCH: 31.4 pg (ref 26.0–34.0)
MCHC: 33.4 % (ref 32.0–36.0)
MCV: 93.9 fL (ref 80.0–98.0)
MPV: 7.3 fL — ABNORMAL LOW (ref 7.4–10.4)
Monocytes: 9.3 % (ref 2.0–10.0)
Platelets: 338 10*3/uL (ref 140–440)
RBC: 4.42 10*6/uL (ref 4.40–5.90)
RDW: 13.3 % (ref 11.5–14.5)
WBC: 9.1 10*3/uL (ref 3.6–10.7)

## 2019-04-05 LAB — ADD ON LAB TEST

## 2019-04-05 LAB — C-REACTIVE PROTEIN: CRP: <5 mg/L (ref 0.0–6.0)

## 2019-04-05 MED ORDER — PHENOBARBITAL 64.8 MG PO TABS
64.8 MG | ORAL | Status: DC
Start: 2019-04-05 — End: 2019-04-06
  Administered 2019-04-05 – 2019-04-06 (×6): 64.8 mg via ORAL

## 2019-04-05 MED FILL — PHENOBARBITAL 32.4 MG PO TABS: 32.4 mg | ORAL | Qty: 1

## 2019-04-05 MED FILL — THERA-M PO TABS: ORAL | Qty: 1

## 2019-04-05 MED FILL — ARIPIPRAZOLE 10 MG PO TABS: 10 mg | ORAL | Qty: 1

## 2019-04-05 MED FILL — BUSPIRONE HCL 10 MG PO TABS: 10 mg | ORAL | Qty: 1

## 2019-04-05 MED FILL — PHENOBARBITAL 64.8 MG PO TABS: 64.8 mg | ORAL | Qty: 1

## 2019-04-05 MED FILL — ACETAMINOPHEN 325 MG PO TABS: 325 mg | ORAL | Qty: 2

## 2019-04-05 MED FILL — FOLIC ACID 1 MG PO TABS: 1 mg | ORAL | Qty: 1

## 2019-04-05 MED FILL — THIAMINE MONONITRATE 100 MG PO TABS: 100 mg | ORAL | Qty: 1

## 2019-04-05 MED FILL — ENOXAPARIN SODIUM 40 MG/0.4ML SC SOLN: 40 MG/0.4ML | SUBCUTANEOUS | Qty: 0.4

## 2019-04-05 MED FILL — MIRTAZAPINE 15 MG PO TABS: 15 mg | ORAL | Qty: 1

## 2019-04-05 NOTE — Care Coordination-Inpatient (Addendum)
Length of Stay: 2           Anticipated date of discharge: 2-3 days pending adm management    Medical Plan of Care: Patient is in hospital for alcohol use disorder, detox. ADM following. CIWAs. Phenobarb taper. Thiamine. Psych following.    DC Disposition: Discharge to community vs return to [facility] (recently left [facility]). Will have to quarantine 10 days due to COVID-19 infection.     SW Autumn following.     Discharge Barriers: Patient is homeless. [facility] suggested that we do a rapid COVID-19 test on patient in 3-4 days and if he tests negative, he can go directly there. If not, will have to find a place to stay for the remainder of his quarantine.    Electronically signed by Anselm LisJoshua Vira Chaplin, RN on 04/05/19 at 8:21 AM EST

## 2019-04-05 NOTE — Progress Notes (Signed)
[facility] Progress Note        Charles Fuller  DOB:  [birth year](48 y.o.)  MRN:  [id number]    Date: 04/05/19   Subjective:    Chief complaint: EtOH withdrawal    Due to the current COVID-19 pandemic, I wore an N95 mask and surgical mask during my encounter with this patient.     HPI  [name] is a 48 yo M with PMHx of anxiety, bipolar disorder, EtOH abuse who is coming in with request or detoxification. ??Pt says that he was recently at a rehab facility and left due to worsening anxiety. ??After leaving, about 7 days ago, he began drinking EtOH again, consuming 15 24 ounce cans of beer daily. ??No hx of  ??No hx of seizures or ICU admissions for withdrawal. ??He has not had any SOB, fever, cough, or other COVID-like symptoms and had no known COVID contacts. He tested (+) here on 12/29. ??Treatment is not indicated. ??    Pt seen and examined.  The patient feels their symptoms are improving.  Pt reports his withdrawal sx are improving and he remains free of respiratory sx.     No overnight events.        Scheduled Meds:  ??? PHENobarbital  97.2 mg Oral Q4H   ??? busPIRone  10 mg Oral TID   ??? ARIPiprazole  10 mg Oral Daily   ??? mirtazapine  15 mg Oral Nightly   ??? thiamine mononitrate  100 mg Oral Daily   ??? therapeutic multivitamin-minerals  1 tablet Oral Daily   ??? enoxaparin  40 mg Subcutaneous Daily   ??? folic acid  1 mg Oral Daily   ??? sodium chloride flush  10 mL Intravenous 2 times per day     Continuous Infusions:  PRN Meds:benzonatate, albuterol sulfate HFA, propranolol, hydrOXYzine, acetaminophen **OR** acetaminophen, melatonin, polyethylene glycol, promethazine **OR** ondansetron, sodium chloride flush    Review of Systems   Constitutional: Negative for chills and fever.   Respiratory: Negative for cough and shortness of breath.    Cardiovascular: Negative for chest pain.         Interval Pertinent History:  Social History     Tobacco Use   ??? Smoking status: Light Tobacco Smoker   ??? Smokeless tobacco: Never  Used   Substance Use Topics   ??? Alcohol use: Yes     Comment: 15-25oz cans daily       Objective:     Patient Vitals for the past 24 hrs:   BP Temp Temp src Pulse Resp SpO2 Height   04/05/19 0430 122/81 98.6 ??F (37 ??C) -- 74 16 98 % --   04/05/19 0230 114/78 98.4 ??F (36.9 ??C) -- 66 16 97 % --   04/04/19 1930 131/83 98.2 ??F (36.8 ??C) Temporal 95 18 96 % --   04/04/19 1829 (!) 144/98 98.2 ??F (36.8 ??C) Temporal 56 18 94 % --   04/04/19 1053 -- -- -- -- -- -- 5\' 8"  (1.727 m)       Physical Exam  Constitutional: Pt is well appearing, somnolent on my arrival.  in no acute distress.   Head: Normocephalic, atraumatic.   Cardiovascular: RRR, without murmurs, rubs, or gallops  Respiratory: Lungs clear to auscultation bilaterally. No wheezing. No respiratory distress  Abdomen: Soft, nontender. No masses appreciated.  Psych: Normal affect, Normal concentration.     Average, Min, and Max for last 24 hours Vitals:  TEMPERATURE:  Temp  Avg: 98.4 ??F (  36.9 ??C)  Min: 98.2 ??F (36.8 ??C)  Max: 98.6 ??F (37 ??C)    RESPIRATIONS RANGE: Resp  Avg: 17  Min: 16  Max: 18    PULSE RANGE: Pulse  Avg: 72.8  Min: 56  Max: 95    BLOOD PRESSURE RANGE:  Systolic (24hrs), Avg:128 , Min:114 , Max:144   ; Diastolic (24hrs), Avg:85, Min:78, Max:98      PULSE OXIMETRY RANGE: SpO2  Avg: 96.3 %  Min: 94 %  Max: 98 %    No intake/output data recorded.        Lab Results   Component Value Date    WBC 9.1 04/05/2019    HGB 13.9 04/05/2019    HCT 41.6 04/05/2019    MCV 93.9 04/05/2019    PLT 338 04/05/2019     Lab Results   Component Value Date    NA 138 04/05/2019    K 3.8 04/05/2019    CL 107 04/05/2019    CO2 24 04/05/2019    BUN 12 04/05/2019    CREATININE 0.98 04/05/2019    GLUCOSE 96 04/05/2019    CALCIUM 8.5 04/05/2019        No results found for: LABA1C   Additional results of the last 24 hours have been reviewed.    Assessment and Plan:         Active Problems:    Severe alcohol use disorder (HCC)    COVID-19    Anxiety    Cocaine abuse (HCC)    Alcohol  withdrawal syndrome with complication (HCC)    Homeless  Resolved Problems:    * No resolved hospital problems. *      #Alcohol and cocaine abuse/detox   -Utox positive for cocaine, EtOH level negative  -ADM consult, phenobarb 97.2 q 4h  -thiamine, folic acid supplements, CIWA, seizure precautions  ??  #Novel CoronaVirus Infection (COVID-19) [U07.1]  -COVID positive??12/29-isolation until 04/12/2019 ,??asymptomatic-low inflammatory markers, suspect may be false positive? Tested negative 12/17, negative inflammatory markers, CXR and procal  -Respiratory status??on RA-Remdesivir/steroids not indicated as no hypoxia  ??  #Anxiety/depression/Bipolar Disorder- - Appreciate psych recs  - Resume buspar, abilify, remeron      Disposition: Expect he will be medically ok for discharge in 2-3 days; will likely need placement at rehab center; would defer to ADM team    I spent over 51% of total time providing counseling or incoordination of care:  35 minutes   discussed with pt, I personally examined the patient and I personally reviewed chart, data, labs radiology reports    6AM-6PM please page:   Electronically signed by Emerson MonteHenry Melburn Treiber, DO    6PM-6AM please page:  [facility]

## 2019-04-05 NOTE — Progress Notes (Signed)
CHEMICAL DEPENDENCY PROGRESS NOTE      PATIENT: Charles Fuller  MRN: [id number]  hief complaint  Chief Complaint   Patient presents with   ??? Addiction Problem     Pt presents to ED requesting rehad/detox from ETOH & cocaine. Pt left Rehab [facility]) about a week ago and continued to drink and do cocaine. Pt drinks approx 15-25oz cans beer daily with cocaine use daily.       Seen for follow up of substance dependence and use and to monitor withdrawals.     Information regarding SARS-COV-2 monoclonal antibody infusion for the treatment of coronavirus disease 2019 was verbally communicated to the patient consistent with "Fact Sheet for Patients and Parents/Caregivers" prior to receiving the infusion. The patient and/or Parents/Caregivers have been informed that SARS-COV-2 monoclonal antibody infusion is an unapproved treatment that is authorized for use under Emergency Use Authorization, they verbalize understanding and wish to proceed with infusion.     The patient is aware that this EUA was issued for investigational treatment but does not constitute research on behalf of the hospital.      S:   Patient seen and examined. Overall symptoms improving. Anxious about where he will spend his time during the remainder of his 10-day quarantine before he can go back to [employer]. He will likely also have to have a negative COVID test to be accepted back, which will result in even more time away from treatment. SW is following. He does not want to be COVID tested again prior to DC.     REVIEW OF SYMPTOMS: Patient denies CP or SOB.Denies audio-, visual or tactile hallucinations. Denies dizziness or visual changes. Denies acute pain. Denies SI/HI.     O:  Vitals:    04/05/19 1501   BP: 137/87   Pulse: 72   Resp: 18   Temp: 98.3 ??F (36.8 ??C)   SpO2: 96%     Physical exam not performed due to telephone visit.       Recent Results (from the past 24 hour(s))   CBC Auto Differential    Collection Time: 04/05/19  2:35 AM    Result Value Ref Range    WBC 9.1 3.6 - 10.7 10*3/uL    RBC 4.42 4.40 - 5.90 10*6/uL    Hemoglobin 13.9 13.0 - 18.0 g/dL    Hematocrit 41.6 40.0 - 52.0 %    MCV 93.9 80.0 - 98.0 fL    MCH 31.4 26.0 - 34.0 pg    MCHC 33.4 32.0 - 36.0 %    RDW 13.3 11.5 - 14.5 %    Platelets 338 140 - 440 10*3/uL    MPV 7.3 (L) 7.4 - 10.4 fL    Granulocytes % 51.2 40.0 - 80.0 %    Lymphocyte % 37.1 20.0 - 40.0 %    Monocytes 9.3 2.0 - 10.0 %    Eosinophils 2.1 1.0 - 6.0 %    Basophils 0.3 0.0 - 2.0 %    Absolute Neut # 4.7 1.8 - 7.0 10*3/uL    Absolute Lymph # 3.4 1.0 - 4.3 10*3/uL    Absolute Mono # 0.8 0.0 - 0.8 10*3/uL    Absolute Eos # 0.2 0.0 - 0.5 10*3/uL    Absolute Baso # 0.0 0.0 - 0.2 10*3/uL   Comprehensive Metabolic Panel w/ Reflex to MG    Collection Time: 04/05/19  2:35 AM   Result Value Ref Range    Sodium 138 135 - 145 mmol/L  Potassium 3.8 3.5 - 5.1 mmol/L    Chloride 107 98 - 107 mmol/L    CO2 24 22 - 30 mmol/L    Anion Gap 7 NA    Glucose 96 70 - 100 mg/dL    BUN 12 7 - 20 mg/dL    CREATININE 0.98 0.52 - 1.25 mg/dL    eGFR African American >90.0 >60 mL/min    EGFR IF NonAfrican American 89.9 >60 mL/min    Calcium 8.5 8.4 - 10.4 mg/dL    Albumin,Serum 3.2 (L) 3.5 - 5.0 g/dL    Total Protein 6.1 (L) 6.3 - 8.2 g/dL    Total Bilirubin 0.3 0.2 - 1.3 mg/dL    Alkaline Phosphatase 53 38 - 126 U/L    ALT 21 0 - 49 U/L    AST 37 15 - 46 U/L   C-Reactive Protein    Collection Time: 04/05/19  2:35 AM   Result Value Ref Range    CRP <5.0 0.0 - 6.0 mg/L       Assessment/Plan:  1. Severe alcohol use disorder with severe acute withdrawals  - Day 2 of no use   - decrease pheno to 64.35m q4  - cont high dose thiamine   - CIWA scores per unit   ??  2. Anxiety and bipolar d/o hx per chart documentation  - start buspar 145mTID    - psych following, cont home remeron, abilify  - propranolol PRN anxiety with holding parameters    Recovery Plan: Patient to be accepted back [facility] after 10-day quarantine due to  [place] so SW on board to help with d/c planning.      Remaining medical management per primary team.     Will follow.      JuLoura BackMD  Addiction Medicine  04/05/2019 at 3:33 PM

## 2019-04-05 NOTE — Progress Notes (Addendum)
Called for acute onset chest pain 8/10. Per nurse, pain is sharp/pressure and left sided. Started at 23:48. Patient says he always has this on and off but hasn't seen a doctor for it. Patient is not sweating or SOB with it. Pain does not radiate. Checking stat EKG and giving ASA and nitro SL.     On my evaluation, patient appears comfortable watching tv. Says CP is still there and now radiates to his Left shoulder. He says he has this pain several times per month and it lasts for about 15 minutes. CP is now a 6/10 after nitro. Denies diaphoresis, nausea, SOB. CP worsened with palpation and movement. EKG appears essentially unchanged and I reviewed it with the Cardiology fellow. Transferring the patient to telemetry for now to more closely monitor. Initial troponin still pending.

## 2019-04-06 LAB — TROPONIN
Troponin I: <0.012 ng/mL (ref 0.000–0.034)
Troponin I: <0.012 ng/mL (ref 0.000–0.034)

## 2019-04-06 MED ORDER — PHENOBARBITAL 64.8 MG PO TABS
64.8 MG | Freq: Three times a day (TID) | ORAL | Status: DC
Start: 2019-04-06 — End: 2019-04-07
  Administered 2019-04-06 – 2019-04-07 (×3): 64.8 mg via ORAL

## 2019-04-06 MED ORDER — NITROGLYCERIN 0.4 MG SL SUBL
0.4 MG | SUBLINGUAL | Status: DC | PRN
Start: 2019-04-06 — End: 2019-04-08
  Administered 2019-04-06 (×2): 0.4 mg via SUBLINGUAL

## 2019-04-06 MED ORDER — ASPIRIN 81 MG PO CHEW
81 MG | Freq: Once | ORAL | Status: AC
Start: 2019-04-06 — End: 2019-04-05
  Administered 2019-04-06: 05:00:00 324 mg via ORAL

## 2019-04-06 MED ORDER — PHENOBARBITAL 64.8 MG PO TABS
64.8 MG | Freq: Four times a day (QID) | ORAL | Status: DC
Start: 2019-04-06 — End: 2019-04-06

## 2019-04-06 MED FILL — PHENOBARBITAL 64.8 MG PO TABS: 64.8 mg | ORAL | Qty: 1

## 2019-04-06 MED FILL — BUSPIRONE HCL 10 MG PO TABS: 10 mg | ORAL | Qty: 1

## 2019-04-06 MED FILL — FOLIC ACID 1 MG PO TABS: 1 mg | ORAL | Qty: 1

## 2019-04-06 MED FILL — ASPIRIN LOW STRENGTH 81 MG PO CHEW: 81 mg | ORAL | Qty: 4

## 2019-04-06 MED FILL — HYDROXYZINE HCL 10 MG PO TABS: 10 mg | ORAL | Qty: 1

## 2019-04-06 MED FILL — THIAMINE MONONITRATE 100 MG PO TABS: 100 mg | ORAL | Qty: 1

## 2019-04-06 MED FILL — NITROSTAT 0.4 MG SL SUBL: 0.4 mg | SUBLINGUAL | Qty: 25

## 2019-04-06 MED FILL — THERA-M PO TABS: ORAL | Qty: 1

## 2019-04-06 MED FILL — ARIPIPRAZOLE 10 MG PO TABS: 10 mg | ORAL | Qty: 1

## 2019-04-06 MED FILL — MIRTAZAPINE 15 MG PO TABS: 15 mg | ORAL | Qty: 1

## 2019-04-06 NOTE — Plan of Care (Signed)
Problem: Airway Clearance - Ineffective  Goal: Achieve or maintain patent airway  Outcome: Ongoing     Problem: Gas Exchange - Impaired  Goal: Absence of hypoxia  Outcome: Ongoing     Problem: Isolation Precautions - Risk of Spread of Infection  Goal: Prevent transmission of infection  Outcome: Ongoing     Problem: Risk for Fluid Volume Deficit  Goal: Maintain normal heart rhythm  Outcome: Ongoing  Goal: Maintain absence of muscle cramping  Outcome: Ongoing  Goal: Maintain normal serum potassium, sodium, calcium, phosphorus, and pH  Outcome: Ongoing     Problem: Loneliness or Risk for Loneliness  Goal: Demonstrate positive use of time alone when socialization is not possible  Outcome: Ongoing     Problem: Fatigue  Goal: Verbalize increase energy and improved vitality  Outcome: Ongoing     Problem: Patient Education: Go to Patient Education Activity  Goal: Patient/Family Education  Outcome: Ongoing     Problem: Discharge Planning:  Goal: Discharged to appropriate level of care  Description: Discharged to appropriate level of care  Outcome: Ongoing     Problem: Fluid Volume - Deficit:  Goal: Absence of fluid volume deficit signs and symptoms  Description: Absence of fluid volume deficit signs and symptoms  Outcome: Ongoing     Problem: Pain:  Goal: Pain level will decrease  Description: Pain level will decrease  Outcome: Ongoing  Goal: Control of acute pain  Description: Control of acute pain  Outcome: Ongoing  Goal: Control of chronic pain  Description: Control of chronic pain  Outcome: Ongoing     Problem: Nutrition Deficit:  Goal: Ability to achieve adequate nutritional intake will improve  Description: Ability to achieve adequate nutritional intake will improve  Outcome: Ongoing     Problem: Falls - Risk of:  Goal: Will remain free from falls  Description: Will remain free from falls  Outcome: Ongoing  Goal: Absence of physical injury  Description: Absence of physical injury  Outcome: Ongoing

## 2019-04-06 NOTE — Progress Notes (Signed)
[facility] Progress Note        Charles Fuller  DOB:  22-Oct-1970(49 y.o.)  MRN:  [id number]    Date: 04/06/19  Subjective:    Chief complaint: EtOH withdrawal    Due to the current COVID-19 pandemic, I wore an N95 mask and surgical mask during my encounter with this patient.     HPI  [name] is a 49 yo M with PMHx of anxiety, bipolar disorder, EtOH abuse who is coming in with request or detoxification. ??Pt says that he was recently at a rehab facility and left due to worsening anxiety. ??After leaving, about 7 days ago, he began drinking EtOH again, consuming 15 24 ounce cans of beer daily. ??No hx of  ??No hx of seizures or ICU admissions for withdrawal. ??He has not had any SOB, fever, cough, or other COVID-like symptoms and had no known COVID contacts. He tested (+) here on 12/29. ??Treatment is not indicated. ??    Pt seen and examined.  The patient feels their symptoms are improving. Did have some CP overnight which has improved. The pain is worse with movement and he thinks is musculoskeletal.  Withdrawal sx are improving.     No overnight events.        Scheduled Meds:  ??? PHENobarbital  64.8 mg Oral Q4H   ??? busPIRone  10 mg Oral TID   ??? ARIPiprazole  10 mg Oral Daily   ??? mirtazapine  15 mg Oral Nightly   ??? thiamine mononitrate  100 mg Oral Daily   ??? therapeutic multivitamin-minerals  1 tablet Oral Daily   ??? enoxaparin  40 mg Subcutaneous Daily   ??? folic acid  1 mg Oral Daily   ??? sodium chloride flush  10 mL Intravenous 2 times per day     Continuous Infusions:  PRN Meds:nitroGLYCERIN, benzonatate, albuterol sulfate HFA, propranolol, hydrOXYzine, acetaminophen **OR** acetaminophen, melatonin, polyethylene glycol, promethazine **OR** ondansetron, sodium chloride flush    Review of Systems   Constitutional: Negative for chills and fever.   Respiratory: Negative for cough and shortness of breath.    Cardiovascular: Negative for chest pain.         Interval Pertinent History:  Social History     Tobacco Use    ??? Smoking status: Light Tobacco Smoker   ??? Smokeless tobacco: Never Used   Substance Use Topics   ??? Alcohol use: Yes     Comment: 15-25oz cans daily       Objective:     Patient Vitals for the past 24 hrs:   BP Temp Temp src Pulse Resp SpO2   04/06/19 0746 (!) 140/72 97 ??F (36.1 ??C) Oral 66 16 96 %   04/06/19 0406 112/69 97.7 ??F (36.5 ??C) Oral 56 16 96 %   04/05/19 2343 (!) 132/96 97.5 ??F (36.4 ??C) Temporal 58 18 93 %   04/05/19 1501 137/87 98.3 ??F (36.8 ??C) Temporal 72 18 96 %       Physical Exam  Constitutional: Pt is well appearing.  in no acute distress.   Head: Normocephalic, atraumatic.   Cardiovascular: RRR, without murmurs, rubs, or gallops  Respiratory: Lungs clear to auscultation bilaterally. No wheezing. No respiratory distress  Abdomen: Soft, nontender. No masses appreciated.  Psych: Normal affect, Normal concentration.     Average, Min, and Max for last 24 hours Vitals:  TEMPERATURE:  Temp  Avg: 97.6 ??F (36.4 ??C)  Min: 97 ??F (36.1 ??C)  Max: 98.3 ??F (36.8 ??  C)    RESPIRATIONS RANGE: Resp  Avg: 17  Min: 16  Max: 18    PULSE RANGE: Pulse  Avg: 63  Min: 56  Max: 72    BLOOD PRESSURE RANGE:  Systolic (04VWU), JWJ:191 , Min:112 , YNW:295   ; Diastolic (62ZHY), QMV:78, Min:69, Max:96      PULSE OXIMETRY RANGE: SpO2  Avg: 95.3 %  Min: 93 %  Max: 96 %    I/O last 3 completed shifts:  In: 1500 [P.O.:1500]  Out: -         Lab Results   Component Value Date    WBC 9.1 04/05/2019    HGB 13.9 04/05/2019    HCT 41.6 04/05/2019    MCV 93.9 04/05/2019    PLT 338 04/05/2019     Lab Results   Component Value Date    NA 138 04/05/2019    K 3.8 04/05/2019    CL 107 04/05/2019    CO2 24 04/05/2019    BUN 12 04/05/2019    CREATININE 0.98 04/05/2019    GLUCOSE 96 04/05/2019    CALCIUM 8.5 04/05/2019        No results found for: LABA1C   Additional results of the last 24 hours have been reviewed.    Assessment and Plan:         Active Problems:    Severe alcohol use disorder (HCC)    COVID-19    Anxiety    Cocaine abuse  (HCC)    Alcohol withdrawal syndrome with complication ([place])    Homeless  Resolved Problems:    * No resolved hospital problems. *      #Alcohol and cocaine abuse/detox   -Utox positive for cocaine, EtOH level negative  -ADM consult, phenobarb 97.2 q 4h  -thiamine, folic acid supplements, CIWA, seizure precautions  ??  #Novel CoronaVirus Infection (COVID-19) [U07.1]  -COVID positive??12/29-isolation until 04/12/2019 ,??asymptomatic-low inflammatory markers, suspect may be false positive? Tested negative 12/17, negative inflammatory markers, CXR and procal  -Respiratory status??on RA-Remdesivir/steroids not indicated as no hypoxia  ??  #Anxiety/depression/Bipolar Disorder- - Appreciate psych recs  - Resume buspar, abilify, remeron      Disposition: Expect he will be medically ok for discharge in 1-2 days; will likely need placement at rehab center; would defer to ADM team    I spent over 51% of total time providing counseling or incoordination of care:  35 minutes   discussed with pt, I personally examined the patient and I personally reviewed chart, data, labs radiology reports    6AM-6PM please page:   Electronically signed by Larwance Rote, DO    6PM-6AM please page:  [facility] Internal Medicine

## 2019-04-06 NOTE — Progress Notes (Signed)
CHEMICAL DEPENDENCY PROGRESS NOTE      PATIENT: Charles Fuller  MRN: [id number]  hief complaint  Chief Complaint   Patient presents with   ??? Addiction Problem     Pt presents to ED requesting rehad/detox from ETOH & cocaine. Pt left Rehab [facility]) about a week ago and continued to drink and do cocaine. Pt drinks approx 15-25oz cans beer daily with cocaine use daily.       Seen for follow up of substance dependence and use and to monitor withdrawals.     Information regarding SARS-COV-2 monoclonal antibody infusion for the treatment of coronavirus disease 2019 was verbally communicated to the patient consistent with "Fact Sheet for Patients and Parents/Caregivers" prior to receiving the infusion. The patient and/or Parents/Caregivers have been informed that SARS-COV-2 monoclonal antibody infusion is an unapproved treatment that is authorized for use under Emergency Use Authorization, they verbalize understanding and wish to proceed with infusion.     The patient is aware that this EUA was issued for investigational treatment but does not constitute research on behalf of the hospital.      S:   Patient called twice and did not answer the room phone, so did a chart review on pt. CIWA 0-0-0 and per IM physician note his sx of withdrawal continue to improve.     REVIEW OF SYMPTOMS: Patient denies CP or SOB.Denies audio-, visual or tactile hallucinations. Denies dizziness or visual changes. Denies acute pain. Denies SI/HI.     O:  Vitals:    04/06/19 1045   BP: (!) 162/106   Pulse: 69   Resp: 16   Temp: 98.2 ??F (36.8 ??C)   SpO2: 98%     Physical exam not performed due to telephone visit.       Recent Results (from the past 24 hour(s))   Troponin    Collection Time: 04/06/19 12:23 AM   Result Value Ref Range    Troponin I <0.012 0.000 - 0.034 ng/mL   Troponin    Collection Time: 04/06/19  5:04 AM   Result Value Ref Range    Troponin I <0.012 0.000 - 0.034 ng/mL       Assessment/Plan:  1. Severe alcohol use disorder  with severe acute withdrawals  - Day 3 of no use   - decrease pheno to 64.8mg  q8 today  - cont high dose thiamine   - CIWA scores per unit   ??  2. Anxiety and bipolar d/o hx per chart documentation  - buspar 10mg  TID    - psych following, cont home remeron, abilify  - propranolol PRN anxiety with holding parameters    Recovery Plan: Patient to be accepted back at [facility] after 10-day quarantine due to COVID. It was suggested by [facility] that he be re-tested for COVID today or tomorrow and if he tests negative he can return directly there. If not, he is homeless so he will need to find somewhere to stay. SW consulted, following.     Remaining medical management per primary team.     Will follow.      , MD  Addiction Medicine  04/06/2019 at 2:11 PM

## 2019-04-07 LAB — PHENOBARBITAL LEVEL: Phenobarbital: 16.1 ug/mL (ref 15.0–40.0)

## 2019-04-07 MED ORDER — PHENOBARBITAL 64.8 MG PO TABS
64.8 MG | Freq: Three times a day (TID) | ORAL | Status: DC
Start: 2019-04-07 — End: 2019-04-07

## 2019-04-07 MED ORDER — THIAMINE MONONITRATE 100 MG PO TABS
100 MG | Freq: Three times a day (TID) | ORAL | Status: DC
Start: 2019-04-07 — End: 2019-04-08
  Administered 2019-04-08 (×3): 100 mg via ORAL

## 2019-04-07 MED ORDER — PHENOBARBITAL 64.8 MG PO TABS
64.8 MG | Freq: Three times a day (TID) | ORAL | Status: AC
Start: 2019-04-07 — End: 2019-04-08
  Administered 2019-04-07 – 2019-04-08 (×3): 32.4 mg via ORAL

## 2019-04-07 MED FILL — MIRTAZAPINE 15 MG PO TABS: 15 mg | ORAL | Qty: 1

## 2019-04-07 MED FILL — BUSPIRONE HCL 10 MG PO TABS: 10 mg | ORAL | Qty: 1

## 2019-04-07 MED FILL — FOLIC ACID 1 MG PO TABS: 1 mg | ORAL | Qty: 1

## 2019-04-07 MED FILL — ENOXAPARIN SODIUM 40 MG/0.4ML SC SOLN: 40 MG/0.4ML | SUBCUTANEOUS | Qty: 0.4

## 2019-04-07 MED FILL — PHENOBARBITAL 64.8 MG PO TABS: 64.8 mg | ORAL | Qty: 1

## 2019-04-07 MED FILL — THERA-M PO TABS: ORAL | Qty: 1

## 2019-04-07 MED FILL — ARIPIPRAZOLE 10 MG PO TABS: 10 mg | ORAL | Qty: 1

## 2019-04-07 MED FILL — THIAMINE MONONITRATE 100 MG PO TABS: 100 mg | ORAL | Qty: 1

## 2019-04-07 NOTE — Progress Notes (Signed)
[facility] Progress Note        Charles Fuller  DOB:  [birth year](48 y.o.)  MRN:  [id number]    Date: 04/07/19  Subjective:    Chief complaint: EtOH withdrawal    Due to the current COVID-19 pandemic, I wore an N95 mask and surgical mask during my encounter with this patient.     HPI  Charles Fuller is a 49 yo M with PMHx of anxiety, bipolar disorder, EtOH abuse who is coming in with request or detoxification. ??Pt says that he was recently at a rehab facility and left due to worsening anxiety. ??After leaving, about 7 days ago, he began drinking EtOH again, consuming 15 24 ounce cans of beer daily. ??No hx of  ??No hx of seizures or ICU admissions for withdrawal. ??He has not had any SOB, fever, cough, or other COVID-like symptoms and had no known COVID contacts. He tested (+) here on 12/29. ??Treatment is not indicated. ??    Pt seen and examined.  The patient feels their symptoms are improving. He has no complaints today.     No overnight events.        Scheduled Meds:  ??? PHENobarbital  64.8 mg Oral Q8H   ??? busPIRone  10 mg Oral TID   ??? ARIPiprazole  10 mg Oral Daily   ??? mirtazapine  15 mg Oral Nightly   ??? thiamine mononitrate  100 mg Oral Daily   ??? therapeutic multivitamin-minerals  1 tablet Oral Daily   ??? enoxaparin  40 mg Subcutaneous Daily   ??? folic acid  1 mg Oral Daily   ??? sodium chloride flush  10 mL Intravenous 2 times per day     Continuous Infusions:  PRN Meds:nitroGLYCERIN, benzonatate, albuterol sulfate HFA, propranolol, hydrOXYzine, acetaminophen **OR** acetaminophen, melatonin, polyethylene glycol, promethazine **OR** ondansetron, sodium chloride flush    Review of Systems   Constitutional: Negative for chills and fever.   Respiratory: Negative for cough and shortness of breath.    Cardiovascular: Negative for chest pain.         Interval Pertinent History:  Social History     Tobacco Use   ??? Smoking status: Light Tobacco Smoker   ??? Smokeless tobacco: Never Used   Substance Use Topics   ??? Alcohol use:  Yes     Comment: 15-25oz cans daily       Objective:     Patient Vitals for the past 24 hrs:   BP Temp Temp src Pulse Resp SpO2   04/07/19 0736 130/86 97 ??F (36.1 ??C) Oral 55 18 98 %   04/07/19 0257 (!) 132/91 97.5 ??F (36.4 ??C) Temporal 57 16 96 %   04/06/19 2328 138/80 97.6 ??F (36.4 ??C) Temporal 61 16 93 %   04/06/19 1950 (!) 159/104 98.6 ??F (37 ??C) Temporal 65 16 95 %   04/06/19 1548 123/66 98.2 ??F (36.8 ??C) Oral 60 17 97 %   04/06/19 1045 (!) 162/106 98.2 ??F (36.8 ??C) Oral 69 16 98 %       Physical Exam  Constitutional: Pt is well appearing.  in no acute distress.   Head: Normocephalic, atraumatic.   Cardiovascular: RRR, without murmurs, rubs, or gallops  Respiratory: Lungs clear to auscultation bilaterally. No wheezing. No respiratory distress  Abdomen: Soft, nontender. No masses appreciated.  Psych: Normal affect, Normal concentration.     Average, Min, and Max for last 24 hours Vitals:  TEMPERATURE:  Temp  Avg: 97.9 ??F (36.6 ??C)  Min: 97 ??F (36.1 ??C)  Max: 98.6 ??F (37 ??C)    RESPIRATIONS RANGE: Resp  Avg: 16.5  Min: 16  Max: 18    PULSE RANGE: Pulse  Avg: 61.2  Min: 55  Max: 69    BLOOD PRESSURE RANGE:  Systolic (24hrs), Avg:141 , Min:123 , Max:162   ; Diastolic (24hrs), Avg:89, Min:66, Max:106      PULSE OXIMETRY RANGE: SpO2  Avg: 96.2 %  Min: 93 %  Max: 98 %    No intake/output data recorded.        Lab Results   Component Value Date    WBC 9.1 04/05/2019    HGB 13.9 04/05/2019    HCT 41.6 04/05/2019    MCV 93.9 04/05/2019    PLT 338 04/05/2019     Lab Results   Component Value Date    NA 138 04/05/2019    K 3.8 04/05/2019    CL 107 04/05/2019    CO2 24 04/05/2019    BUN 12 04/05/2019    CREATININE 0.98 04/05/2019    GLUCOSE 96 04/05/2019    CALCIUM 8.5 04/05/2019        No results found for: LABA1C   Additional results of the last 24 hours have been reviewed.    Assessment and Plan:         Active Problems:    Severe alcohol use disorder (HCC)    COVID-19    Anxiety    Alcohol dependence with unspecified  alcohol-induced disorder (HCC)    Cocaine abuse (HCC)    Alcohol withdrawal syndrome with complication (HCC)    Homeless  Resolved Problems:    * No resolved hospital problems. *      #Alcohol and cocaine abuse/detox   -Utox positive for cocaine, EtOH level negative  -ADM consult, phenobarb 97.2 q 4h  -thiamine, folic acid supplements, CIWA, seizure precautions  ??  #Novel CoronaVirus Infection (COVID-19) [U07.1]  -COVID positive??12/29-isolation until 04/12/2019 ,??asymptomatic-low inflammatory markers, suspect may be false positive? Tested negative 12/17, negative inflammatory markers, CXR and procal  -Respiratory status??on RA-Remdesivir/steroids not indicated as no hypoxia  - Rechecking COVID test today, as pt may return to [facility] immediately if negative, and there was concern of false positive with first test  ??  #Anxiety/depression/Bipolar Disorder- - Appreciate psych recs  - Resume buspar, abilify, remeron      Disposition: Pending result of repeat COVID test; may possibly return to [facility] as soon as tomorrow.     I spent over 51% of total time providing counseling or incoordination of care:  25 minutes   discussed with pt, I personally examined the patient and I personally reviewed chart, data, labs radiology reportsD/W ID    6AM-6PM please page:   Electronically signed by Emerson Monte, DO    6PM-6AM please page:  [facility] Internal Medicine

## 2019-04-07 NOTE — Progress Notes (Signed)
CHEMICAL DEPENDENCY PROGRESS NOTE      PATIENT: Charles Fuller  MRN: [id number]  hief complaint  Chief Complaint   Patient presents with   ??? Addiction Problem     Pt presents to ED requesting rehad/detox from ETOH & cocaine. Pt left Rehab [facility]) about a week ago and continued to drink and do cocaine. Pt drinks approx 15-25oz cans beer daily with cocaine use daily.       Seen for follow up of substance dependence and use and to monitor withdrawals.     Information regarding SARS-COV-2 monoclonal antibody infusion for the treatment of coronavirus disease 2019 was verbally communicated to the patient consistent with "Fact Sheet for Patients and Parents/Caregivers" prior to receiving the infusion. The patient and/or Parents/Caregivers have been informed that SARS-COV-2 monoclonal antibody infusion is an unapproved treatment that is authorized for use under Emergency Use Authorization, they verbalize understanding and wish to proceed with infusion.     The patient is aware that this EUA was issued for investigational treatment but does not constitute research on behalf of the hospital.      S:   Patient sounds a bit anxious. "I don't think I'm finished with detox yet." CIWA 0-0-0-0. Still has not figured out his living situation yet pending the time he can get back into [facility]. He was COVID tested today and if negative he can be d/c directly to [facility] rehab.     REVIEW OF SYMPTOMS: Patient denies CP or SOB.Denies audio-, visual or tactile hallucinations. Denies dizziness or visual changes. Denies acute pain. Denies SI/HI.     O:  Vitals:    04/07/19 1125   BP: (!) 147/90   Pulse: 69   Resp: 16   Temp: 98.7 ??F (37.1 ??C)   SpO2: 98%     Physical exam not performed due to telephone visit.       No results found for this or any previous visit (from the past 24 hour(s)).    Assessment/Plan:  1. Severe alcohol use disorder with severe acute withdrawals  - Day 4 of no use   - continue pheno to 64.8mg  q8  today  - will check level and if therapeutic will discontinue, explained to pt  - cont high dose thiamine   - CIWA scores per unit   ??  2. Anxiety and bipolar d/o hx per chart documentation  - buspar 10mg  TID (home med)  - psych following, cont home remeron, abilify  - propranolol PRN anxiety with holding parameters    Recovery Plan: Patient to be accepted back to . If COVID test today comes back negative, he may be discharged directly there. If not he will have to wait 10 days from the date of test positivity (12/29) until he is allowed to go back. Patient is currently homeless and has not yet figured out his living situation if he has to wait a few more days until he can go back to [facility]. SW following.      Remaining medical management per primary team.     Will follow.      11-28-2004, MD  Addiction Medicine  04/07/2019 at 11:57 AM

## 2019-04-07 NOTE — Significant Event (Signed)
Patient's phenobarbital level is within therapeutic limits. On discussion with patient regarding results and his symptoms, will give him 3 more doses of 32.4mg  of phenobarbital , then d/c. COVID test results are still pending.    Berna Spare Trenese Haft, DO

## 2019-04-08 LAB — COVID-19: SARS-CoV-2: NOT DETECTED

## 2019-04-08 MED ORDER — MIRTAZAPINE 15 MG PO TABS
15 MG | ORAL_TABLET | Freq: Every evening | ORAL | 0 refills | Status: AC
Start: 2019-04-08 — End: ?

## 2019-04-08 MED ORDER — THIAMINE MONONITRATE 100 MG PO TABS
100 MG | ORAL_TABLET | Freq: Every day | ORAL | 0 refills | Status: AC
Start: 2019-04-08 — End: ?

## 2019-04-08 MED ORDER — FOLIC ACID 1 MG PO TABS
1 MG | ORAL_TABLET | Freq: Every day | ORAL | 3 refills | Status: DC
Start: 2019-04-08 — End: 2019-04-08

## 2019-04-08 MED ORDER — ARIPIPRAZOLE 10 MG PO TABS
10 MG | ORAL_TABLET | Freq: Every day | ORAL | 3 refills | Status: DC
Start: 2019-04-08 — End: 2019-04-08

## 2019-04-08 MED ORDER — BUSPIRONE HCL 10 MG PO TABS
10 MG | ORAL_TABLET | Freq: Three times a day (TID) | ORAL | 0 refills | Status: AC
Start: 2019-04-08 — End: ?

## 2019-04-08 MED ORDER — PROPRANOLOL HCL 10 MG PO TABS
10 MG | ORAL_TABLET | Freq: Three times a day (TID) | ORAL | 0 refills | Status: AC | PRN
Start: 2019-04-08 — End: ?

## 2019-04-08 MED ORDER — MIRTAZAPINE 15 MG PO TABS
15 MG | ORAL_TABLET | Freq: Every evening | ORAL | 3 refills | Status: DC
Start: 2019-04-08 — End: 2019-04-08

## 2019-04-08 MED ORDER — ARIPIPRAZOLE 10 MG PO TABS
10 MG | ORAL_TABLET | Freq: Every day | ORAL | 3 refills | Status: AC
Start: 2019-04-08 — End: ?

## 2019-04-08 MED ORDER — THIAMINE MONONITRATE 100 MG PO TABS
100 MG | ORAL_TABLET | Freq: Every day | ORAL | 0 refills | Status: DC
Start: 2019-04-08 — End: 2019-04-08

## 2019-04-08 MED ORDER — FOLIC ACID 1 MG PO TABS
1 MG | ORAL_TABLET | Freq: Every day | ORAL | 3 refills | Status: AC
Start: 2019-04-08 — End: ?

## 2019-04-08 MED FILL — BUSPIRONE HCL 10 MG PO TABS: 10 mg | ORAL | Qty: 1

## 2019-04-08 MED FILL — ENOXAPARIN SODIUM 40 MG/0.4ML SC SOLN: 40 MG/0.4ML | SUBCUTANEOUS | Qty: 0.4

## 2019-04-08 MED FILL — HYDROXYZINE HCL 10 MG PO TABS: 10 mg | ORAL | Qty: 3

## 2019-04-08 MED FILL — THERA-M PO TABS: ORAL | Qty: 1

## 2019-04-08 MED FILL — THIAMINE MONONITRATE 100 MG PO TABS: 100 mg | ORAL | Qty: 1

## 2019-04-08 MED FILL — MIRTAZAPINE 15 MG PO TABS: 15 mg | ORAL | Qty: 1

## 2019-04-08 MED FILL — MELATONIN 5 MG PO TABS: 5 mg | ORAL | Qty: 1

## 2019-04-08 MED FILL — PHENOBARBITAL 64.8 MG PO TABS: 64.8 mg | ORAL | Qty: 1

## 2019-04-08 MED FILL — FOLIC ACID 1 MG PO TABS: 1 mg | ORAL | Qty: 1

## 2019-04-08 MED FILL — ARIPIPRAZOLE 10 MG PO TABS: 10 mg | ORAL | Qty: 1

## 2019-04-08 NOTE — Care Coordination-Inpatient (Signed)
Pt now would not like to go to rehab. Requesting to be discharged today. Latest COVID test negative. Updated RN that if pt wanted to go to [facility], he will likely need a copy of his test to take with him. Electronically signed by Mariane Duval, RN on 04/08/19 at 3:46 PM EST

## 2019-04-08 NOTE — Progress Notes (Signed)
Notified Dr Marguerite Olea, TCC and social worker, patient says he does not want to go to rehab and wants to leave now. Dr Marguerite Olea messaged back that he is checking with [facility] for further recommendations

## 2019-04-08 NOTE — Care Coordination-Inpatient (Signed)
Social worker received perfect serve message asking for assistance transitioning patient to [facility] treatment facility. Call placed to facility, spoke with Lauren [phone]) - patient now COVID negative so facility able to accept, will be able to come pick up patient from hospital during normal business hours, asking for patient to be ready at 11am and for hard copies of H&P, toxicology report (UDS), blood work (CBC, CMP, etc.) and recent COVID test results to be sent with patient. Updated floor RN and Dr. Marguerite Olea on above. Electronically signed by Fayrene Fearing, LSW on 04/08/19 at 12:31 PM EST

## 2019-04-08 NOTE — Discharge Summary (Addendum)
[facility] Discharge Summary and Transition Note       Charles Fuller  DOB:  07-10-1970  MRN:  [id number]    ADMIT DATE:  04/03/2019  DISCHARGE DATE:  04/08/2019    PRIMARY CARE PHYSICIAN:  No primary care provider on file.    VISIT STATUS: Admission    CODE STATUS:  Full Code    DISCHARGE DIAGNOSES:    Principal Problem:    Alcohol withdrawal syndrome with complication (HCC)  Active Problems:    Severe alcohol use disorder (HCC)    COVID-19    Anxiety    Alcohol dependence with unspecified alcohol-induced disorder ([place])    Cocaine abuse ([place])    Homeless  Resolved Problems:    * No resolved hospital problems. *      HOSPITAL COURSE:  [name] is a 50 yo M with PMHx of anxiety, bipolar disorder, EtOH abuse who is coming in with request or detoxification. ??Pt says that he was recently at a rehab facility and left due to worsening anxiety. ??After leaving, about 7 days ago, he began drinking EtOH again, consuming 15 24 ounce cans of beer daily. ??No hx of ????No hx of seizures or ICU admissions for withdrawal. ??He has not had any SOB, fever, cough, or other COVID-like symptoms and had no known COVID contacts. He tested (+) here on 12/29.  Addiction Medicine was consulted and he was started on Ativan PRN and phenobarbital with improvement of withdrawal sx.  His COVID test was repeated due to concern for prior test being a false positive, and the repeat test came back negative. Pt was felt to be safe to discharge back to [facility] rehab facility.     Psych was consulted during admission due to anxiety    ADDENDUM  Prior to being discharged to rehab as planned, pt decided that he no longer wanted to go to rehab and instead wanted to go home.  This was discussed with Dr Charles Fuller of [facility], who was agreeable to him being discharged home without any further recommendations.      PROCEDURES:  None    CONSULTANTS:  [facility], Psychiatry    DISCHARGE MEDICATIONS:        Significant Medication Changes: Added folic acid, thiamine,  abilify, remeron     Charles Fuller, Charles Fuller   Home Medication Instructions VOZ:[id number]    Printed on:04/08/19 1209   Medication Information                      ARIPiprazole (ABILIFY) 10 MG tablet  Take 1 tablet by mouth daily             busPIRone HCl (BUSPAR PO)  Take by mouth             folic acid (FOLVITE) 1 MG tablet  Take 1 tablet by mouth daily             mirtazapine (REMERON) 15 MG tablet  Take 1 tablet by mouth nightly             thiamine mononitrate 100 MG tablet  Take 1 tablet by mouth daily                 DIET: regular    ACTIVITY: resume regular activity    SIGNIFICANT DIAGNOSTIC STUDIES:  COVID initially positive, repeat test on 1/2 negative    COMPLEXITY OF FOLLOW UP:   [x]  Moderate Complexity: follow up within 7-14 calendar days (63875)   []   Severe Complexity: follow up within 7 calendar days (42683)    FOLLOW UP TESTING, PENDING RESULTS OR REFERRALS AT TRANSITIONAL CARE VISIT:    [x]   Yes - Follow up with PCP   []   No    DISPOSITION:  Rehab center    FACILITY/HOME CARE AGENCY NAME: [facility]    Follow up with No primary care provider on file. to be scheduled Monday    Notification (telephone encounter) to PCP initiated:    [x]   Yes    []   No    INSTRUCTIONS TO MA/SW: Please call patient on day after discharge (must document patient  contacted within 2 business days of discharge).    FOLLOW UP QUESTIONS FOR MA/SW:  1. Did you get medications filled and taking them as instructed from discharge?  2. Are you following your discharge instructions from your hospital stay?  3. Please confirm patient is scheduled for a follow up appointment within the above time frame.    DISCHARGE TIME: > 30 minutes    Electronically signed by , DO on 04/08/19 at 12:09 PM EST    ADDENDUM  [name] had Obesity (BMI 30 - 39)

## 2019-04-08 NOTE — Progress Notes (Signed)
[facility] Progress Note        Charles Fuller  DOB:  10/26/1970(49 y.o.)  MRN:  [id number]    Date: 04/08/19  Subjective:    Chief complaint: EtOH withdrawal    Due to the current COVID-19 pandemic, I wore an N95 mask and surgical mask during my encounter with this patient.     HPI  Charles Fuller is a 49 yo M with PMHx of anxiety, bipolar disorder, EtOH abuse who is coming in with request or detoxification. ??Pt says that he was recently at a rehab facility and left due to worsening anxiety. ??After leaving, about 7 days ago, he began drinking EtOH again, consuming 15 24 ounce cans of beer daily. ??No hx of  ??No hx of seizures or ICU admissions for withdrawal. ??He has not had any SOB, fever, cough, or other COVID-like symptoms and had no known COVID contacts. He tested (+) here on 12/29.  Addiction Medicine was consulted and he was started on Ativan PRN and phenobarbital with improvement of withdrawal sx.  His COVID test was repeated due to concern for prior test being a false positive, and the repeat test came back negative.     Pt seen and examined.  The patient feels their symptoms are improving. He has no complaints today.     No overnight events.        Scheduled Meds:  ??? PHENobarbital  32.4 mg Oral Q8H   ??? thiamine mononitrate  100 mg Oral TID   ??? busPIRone  10 mg Oral TID   ??? ARIPiprazole  10 mg Oral Daily   ??? mirtazapine  15 mg Oral Nightly   ??? therapeutic multivitamin-minerals  1 tablet Oral Daily   ??? enoxaparin  40 mg Subcutaneous Daily   ??? folic acid  1 mg Oral Daily   ??? sodium chloride flush  10 mL Intravenous 2 times per day     Continuous Infusions:  PRN Meds:nitroGLYCERIN, benzonatate, albuterol sulfate HFA, propranolol, hydrOXYzine, acetaminophen **OR** acetaminophen, melatonin, polyethylene glycol, promethazine **OR** ondansetron, sodium chloride flush    Review of Systems   Constitutional: Negative for chills and fever.   Respiratory: Negative for cough and shortness of breath.     Cardiovascular: Negative for chest pain.         Interval Pertinent History:  Social History     Tobacco Use   ??? Smoking status: Light Tobacco Smoker   ??? Smokeless tobacco: Never Used   Substance Use Topics   ??? Alcohol use: Yes     Comment: 15-25oz cans daily       Objective:     Patient Vitals for the past 24 hrs:   BP Temp Temp src Pulse Resp SpO2   04/08/19 0313 126/81 98.4 ??F (36.9 ??C) Oral 52 16 94 %   04/07/19 2240 120/79 98.2 ??F (36.8 ??C) Oral 61 17 94 %   04/07/19 1930 (!) 149/91 98.6 ??F (37 ??C) Oral 61 17 97 %   04/07/19 1544 137/80 98 ??F (36.7 ??C) Temporal 69 18 96 %   04/07/19 1125 (!) 147/90 98.7 ??F (37.1 ??C) Oral 69 16 98 %       Physical Exam  Constitutional: Pt is well appearing.  in no acute distress.   Head: Normocephalic, atraumatic.   Cardiovascular: RRR, without murmurs, rubs, or gallops  Respiratory: Lungs clear to auscultation bilaterally. No wheezing. No respiratory distress  Abdomen: Soft, nontender. No masses appreciated.  Psych: Normal affect, Normal concentration.  Average, Min, and Max for last 24 hours Vitals:  TEMPERATURE:  Temp  Avg: 98.4 ??F (36.9 ??C)  Min: 98 ??F (36.7 ??C)  Max: 98.7 ??F (37.1 ??C)    RESPIRATIONS RANGE: Resp  Avg: 16.8  Min: 16  Max: 18    PULSE RANGE: Pulse  Avg: 62.4  Min: 52  Max: 69    BLOOD PRESSURE RANGE:  Systolic (24hrs), Avg:136 , Min:120 , Max:149   ; Diastolic (24hrs), Avg:84, Min:79, Max:91      PULSE OXIMETRY RANGE: SpO2  Avg: 95.8 %  Min: 94 %  Max: 98 %    No intake/output data recorded.        Lab Results   Component Value Date    WBC 9.1 04/05/2019    HGB 13.9 04/05/2019    HCT 41.6 04/05/2019    MCV 93.9 04/05/2019    PLT 338 04/05/2019     Lab Results   Component Value Date    NA 138 04/05/2019    K 3.8 04/05/2019    CL 107 04/05/2019    CO2 24 04/05/2019    BUN 12 04/05/2019    CREATININE 0.98 04/05/2019    GLUCOSE 96 04/05/2019    CALCIUM 8.5 04/05/2019        No results found for: LABA1C   Additional results of the last 24 hours have been  reviewed.    Assessment and Plan:         Active Problems:    Severe alcohol use disorder (HCC)    COVID-19    Anxiety    Alcohol dependence with unspecified alcohol-induced disorder (HCC)    Cocaine abuse (HCC)    Alcohol withdrawal syndrome with complication (HCC)    Homeless  Resolved Problems:    * No resolved hospital problems. *      #Alcohol and cocaine abuse/detox   -Utox positive for cocaine, EtOH level negative  -ADM consult, phenobarb taper; in therapeutic range  -thiamine, folic acid supplements, CIWA, seizure precautions  ??  #Novel CoronaVirus Infection (COVID-19) [U07.1]  -COVID positive??12/29-isolation until 04/12/2019 ,??asymptomatic-low inflammatory markers.  Repeat COVID test came back negative - suspect first test was a false positive  ??  #Anxiety/depression/Bipolar Disorder- - Appreciate psych recs  - Resume buspar, abilify, remeron      Disposition: Pt is safe for discharge to [facility]    I spent over 51% of total time providing counseling or incoordination of care:  25 minutes   discussed with pt, I personally examined the patient and I personally reviewed chart, data, labs radiology reportsD/W ID, ADM    6AM-6PM please page:   Electronically signed by Emerson Monte, DO    6PM-6AM please page:  [facility] Internal Medicine

## 2019-04-08 NOTE — Progress Notes (Signed)
Reviewed patient's chart. Finished his detox last night. Repeat COVID test was negative so he can go back to [facility], who will accept him given negative COVID test. Social Work following to help coordinate discharge there.     Will sign off.     Berna Spare Debbie Bellucci, DO

## 2019-04-08 NOTE — Discharge Instructions (Signed)
please contact [facility] [phone]) for outpatient mental health care.    Avoid drinking alcohol as much as possible in the future.  It is ABSOLUTELY ESSENTIAL that you NOT drink any alcohol for at least the next one to two weeks as you have received medications that will interact with alcohol and this combination can be LIFE THREATENING.  Follow up with your primary care physician.     Do not drive home from the hospital as you have been given medications which may impair your ability to drive.

## 2019-04-08 NOTE — Progress Notes (Signed)
Patient's repeat Covid test came back negative. Dr. Linus Galas notified via perfect serve. Will continue to monitor. Electronically signed by Randolph Bing, RN on 04/08/2019 at 1:03 AM

## 2019-04-08 NOTE — Progress Notes (Signed)
IV discontinued, homegoing instructions reviewed with patient. Stressed to patient the danger of drinking ETOH with the recent withdrawal meds he has been taking and also reviewed this section of his written instructions.  Patient packed belongings and is waiting for friend to arrive for pickup.

## 2022-08-17 ENCOUNTER — Ambulatory Visit (HOSPITAL_COMMUNITY): Payer: Self-pay

## 2022-10-20 ENCOUNTER — Ambulatory Visit
Admission: RE | Admit: 2022-10-20 | Discharge: 2022-10-20 | Disposition: A | Discharge status: Still In Hospital | Payer: Self-pay | Source: Ambulatory Visit | Attending: Internal Medicine | Admitting: Internal Medicine

## 2022-10-20 ENCOUNTER — Ambulatory Visit: Payer: Self-pay | Attending: Internal Medicine | Admitting: Internal Medicine

## 2022-10-20 ENCOUNTER — Other Ambulatory Visit: Payer: Self-pay

## 2022-10-20 DIAGNOSIS — Z0000 Encounter for general adult medical examination without abnormal findings: Secondary | ICD-10-CM

## 2022-10-20 DIAGNOSIS — Z021 Encounter for pre-employment examination: Secondary | ICD-10-CM | POA: Insufficient documentation

## 2022-10-20 NOTE — Progress Notes (Signed)
Patient here for work-related exam.  See documentation in paper chart.    Weyman Croon, DO  10/20/2022, 15:56
